# Patient Record
Sex: Female | Born: 1970 | Race: White | Hispanic: No | Marital: Married | Smoking: Never smoker
Health system: Southern US, Community
[De-identification: ages and names within clinical notes are randomized; demographics above are authoritative.]

## PROBLEM LIST (undated history)

## (undated) DIAGNOSIS — T7840XA Allergy, unspecified, initial encounter: Secondary | ICD-10-CM

## (undated) DIAGNOSIS — D649 Anemia, unspecified: Secondary | ICD-10-CM

## (undated) DIAGNOSIS — Z1589 Genetic susceptibility to other disease: Secondary | ICD-10-CM

## (undated) DIAGNOSIS — F419 Anxiety disorder, unspecified: Secondary | ICD-10-CM

## (undated) DIAGNOSIS — M1990 Unspecified osteoarthritis, unspecified site: Secondary | ICD-10-CM

## (undated) DIAGNOSIS — Q513 Bicornate uterus: Secondary | ICD-10-CM

## (undated) DIAGNOSIS — Z8489 Family history of other specified conditions: Secondary | ICD-10-CM

## (undated) DIAGNOSIS — E7211 Homocystinuria: Secondary | ICD-10-CM

## (undated) DIAGNOSIS — E7212 Methylenetetrahydrofolate reductase deficiency: Secondary | ICD-10-CM

## (undated) DIAGNOSIS — D689 Coagulation defect, unspecified: Secondary | ICD-10-CM

## (undated) DIAGNOSIS — N63 Unspecified lump in unspecified breast: Principal | ICD-10-CM

## (undated) HISTORY — DX: Methylenetetrahydrofolate reductase deficiency: E72.12

## (undated) HISTORY — DX: Unspecified osteoarthritis, unspecified site: M19.90

## (undated) HISTORY — PX: BREAST LUMPECTOMY: SHX2

## (undated) HISTORY — DX: Anxiety disorder, unspecified: F41.9

## (undated) HISTORY — DX: Genetic susceptibility to other disease: Z15.89

## (undated) HISTORY — PX: EYE SURGERY: SHX253

## (undated) HISTORY — DX: Bicornate uterus: Q51.3

## (undated) HISTORY — DX: Allergy, unspecified, initial encounter: T78.40XA

## (undated) HISTORY — DX: Coagulation defect, unspecified: D68.9

## (undated) HISTORY — DX: Methylenetetrahydrofolate reductase deficiency: E72.11

---

## 2006-01-01 ENCOUNTER — Emergency Department (HOSPITAL_COMMUNITY): Admission: EM | Admit: 2006-01-01 | Discharge: 2006-01-01 | Payer: Self-pay | Admitting: Emergency Medicine

## 2006-03-28 ENCOUNTER — Ambulatory Visit: Payer: Self-pay | Admitting: Oncology

## 2006-05-11 ENCOUNTER — Ambulatory Visit: Payer: Self-pay | Admitting: Oncology

## 2006-05-15 LAB — CBC WITH DIFFERENTIAL/PLATELET
Basophils Absolute: 0 10*3/uL (ref 0.0–0.1)
Eosinophils Absolute: 0.1 10*3/uL (ref 0.0–0.5)
HCT: 37.1 % (ref 34.8–46.6)
HGB: 13 g/dL (ref 11.6–15.9)
MONO#: 0.6 10*3/uL (ref 0.1–0.9)
NEUT#: 5.9 10*3/uL (ref 1.5–6.5)
NEUT%: 69.9 % (ref 39.6–76.8)
RDW: 13.7 % (ref 11.3–14.5)
WBC: 8.4 10*3/uL (ref 3.9–10.0)
lymph#: 1.7 10*3/uL (ref 0.9–3.3)

## 2006-05-15 LAB — APTT: aPTT: 29 seconds (ref 24–37)

## 2006-05-15 LAB — MORPHOLOGY: PLT EST: ADEQUATE

## 2006-05-16 LAB — SEDIMENTATION RATE: Sed Rate: 2 mm/hr (ref 0–22)

## 2006-07-19 ENCOUNTER — Encounter: Admission: RE | Admit: 2006-07-19 | Discharge: 2006-07-19 | Payer: Self-pay | Admitting: Obstetrics and Gynecology

## 2009-11-09 ENCOUNTER — Encounter: Admission: RE | Admit: 2009-11-09 | Discharge: 2009-11-09 | Payer: Self-pay | Admitting: Obstetrics and Gynecology

## 2010-01-23 ENCOUNTER — Encounter: Payer: Self-pay | Admitting: Obstetrics and Gynecology

## 2011-01-16 ENCOUNTER — Ambulatory Visit (INDEPENDENT_AMBULATORY_CARE_PROVIDER_SITE_OTHER): Payer: BC Managed Care – PPO | Admitting: Sports Medicine

## 2011-01-16 VITALS — BP 100/60 | Ht 65.0 in | Wt 120.0 lb

## 2011-01-16 DIAGNOSIS — M549 Dorsalgia, unspecified: Secondary | ICD-10-CM

## 2011-01-16 NOTE — Patient Instructions (Signed)
1. Do your rotations for warm up.  2. When you do your weight exercises only do them to your comfort level.  You should not work beyond a pain level of 3 on a 10 scale.  3. Do your exercises daily and increase your reps as your strength increased until you can do 3 sets of 10.  4. Easy running, walking, and swimming are ok to do for cardio.  5. Follow up in one month

## 2011-01-17 DIAGNOSIS — M549 Dorsalgia, unspecified: Secondary | ICD-10-CM | POA: Insufficient documentation

## 2011-01-17 NOTE — Assessment & Plan Note (Addendum)
We have given her a HEP for her back pain and for lower extremity weakness and stiffness.  She should continue to do her cardiovascular exercise at her comfort level.  We will follow up with her in one month.

## 2011-01-17 NOTE — Progress Notes (Signed)
  Subjective:    Patient ID: Lindsay Reynolds, female    DOB: [birth year], 41 y.o.   MRN: [id number]  HPI 41 y/o female is here complaining of back and side pain that started after moving heavy furniture 3 months ago.  She has tried OTC's with no relief.  Yoga improved the back pain but the side pain remains.  The pain is achy, starts on her left side and radiates around to the front.  She has no groin pain or hip pain.  She has no leg pain, numbness or weakness.  No incontinence.  No saddle anesthesia.  No history of back trauma.  She is a running but has had difficulty running since this event.  She was improving initially until she took a 10 hour car ride in for Thanksgiving and the pain returned.  Today she has mostly side pain.   Review of Systems     Objective:   Physical Exam  Tenderness to palpation over the right oblique muscle and the quadratus Normal range of motion at the lumbar spine Lower extremity strength is symmetric but she has a generalized weakness about the hips c/w deconditioning DTR's 2+ FABER is non-painful The left leg is longer than the right by one cm but this corrects when she sits up        Assessment & Plan:

## 2011-02-03 HISTORY — PX: EYE SURGERY: SHX253

## 2011-02-11 DIAGNOSIS — E7212 Methylenetetrahydrofolate reductase deficiency: Secondary | ICD-10-CM

## 2011-02-11 DIAGNOSIS — Q513 Bicornate uterus: Secondary | ICD-10-CM

## 2011-02-11 DIAGNOSIS — N83209 Unspecified ovarian cyst, unspecified side: Secondary | ICD-10-CM

## 2011-02-11 DIAGNOSIS — Z9889 Other specified postprocedural states: Secondary | ICD-10-CM | POA: Insufficient documentation

## 2011-02-21 ENCOUNTER — Ambulatory Visit: Payer: BC Managed Care – PPO | Admitting: Sports Medicine

## 2011-11-14 ENCOUNTER — Telehealth: Payer: Self-pay | Admitting: Obstetrics and Gynecology

## 2011-11-14 NOTE — Telephone Encounter (Signed)
Apt scheduled with SR on 11/24/2011 @ 12:40 p.m. Pt voiced understanding Lindsay Reynolds, Lindsay Reynolds

## 2011-11-14 NOTE — Telephone Encounter (Signed)
Staff msg sent to SR to ask about opening on tomorrows schedule.

## 2011-11-14 NOTE — Telephone Encounter (Signed)
LD to address.

## 2011-11-24 ENCOUNTER — Encounter: Payer: Self-pay | Admitting: Obstetrics and Gynecology

## 2011-11-24 ENCOUNTER — Ambulatory Visit (INDEPENDENT_AMBULATORY_CARE_PROVIDER_SITE_OTHER): Payer: BC Managed Care – PPO | Admitting: Obstetrics and Gynecology

## 2011-11-24 VITALS — BP 102/58 | Wt 111.0 lb

## 2011-11-24 DIAGNOSIS — R5383 Other fatigue: Secondary | ICD-10-CM

## 2011-11-24 DIAGNOSIS — D649 Anemia, unspecified: Secondary | ICD-10-CM

## 2011-11-24 DIAGNOSIS — N926 Irregular menstruation, unspecified: Secondary | ICD-10-CM

## 2011-11-24 DIAGNOSIS — R5381 Other malaise: Secondary | ICD-10-CM

## 2011-11-24 DIAGNOSIS — N898 Other specified noninflammatory disorders of vagina: Secondary | ICD-10-CM

## 2011-11-24 LAB — CBC
MCH: 28.7 pg (ref 26.0–34.0)
MCV: 83.8 fL (ref 78.0–100.0)
Platelets: 261 10*3/uL (ref 150–400)
RBC: 4.64 MIL/uL (ref 3.87–5.11)
RDW: 13.7 % (ref 11.5–15.5)

## 2011-11-24 LAB — POCT URINALYSIS DIPSTICK
Bilirubin, UA: NEGATIVE
Blood, UA: NEGATIVE
Glucose, UA: NEGATIVE
Ketones, UA: NEGATIVE
pH, UA: 8

## 2011-11-24 LAB — POCT WET PREP (WET MOUNT)
Bacteria Wet Prep HPF POC: NEGATIVE
Clue Cells Wet Prep Whiff POC: NEGATIVE
WBC, Wet Prep HPF POC: NEGATIVE
pH: 4.5

## 2011-11-24 LAB — POCT OSOM BVBLUE TEST: Bacterial Vaginosis: NEGATIVE

## 2011-11-24 NOTE — Progress Notes (Deleted)
Subjective:    Lindsay Reynolds is a 41 y.o. female, No obstetric history on file., who presents for Vaginal Discomfort.   The following portions of the patient's history were reviewed and updated as appropriate: allergies, current medications, past family history.  Review of Systems Pertinent items are noted in HPI. Breast:Negative for breast lump,nipple discharge or nipple retraction Gastrointestinal: Negative for abdominal pain, change in bowel habits or rectal bleeding Urinary:negative   Objective:    There were no vitals taken for this visit.    Weight:  Wt Readings from Last 1 Encounters:  01/16/11 120 lb (54.432 kg)          BMI: There is no height or weight on file to calculate BMI.  General Appearance: Alert, appropriate appearance for age. No acute distress GYN exam:   Assessment:    {diagnoses; exam gyn:13148}    Plan:    {gyn plan:315269::"mammogram","pap smear","return annually or prn"}  Silverio Lay MD

## 2011-11-24 NOTE — Progress Notes (Signed)
Vaginal discharge: blood tingedthin and thick  Mucoid Now day 20 Itching / Burning: yes Fever: no  Symptoms have been present for 1 week. Has used over-the-counter treatment: yes Associated symptoms:  Pelvic pain: yes       Dyspareunia: yes     Odor:  no  History of STD:  no history of PID, STD's STD screen:declined  Pt stated been having a lot of vaginal discomfort .Pt stated she wants her hormone levels tested.     Vaginal Discharge/Discomfort/Itching  Subjective:   Lindsay Reynolds is an 41 y.o. woman who presents c/o Vaginal discomfort. Vaginal itching and irritation. Having abdominal cramping x 1 month. Cycles are monthly with bleeding lasting x 7 days. Has experienced night sweats on occasion.   Objective: no lesions, no discharge, no CMT, no adnexal masses B Vaginal discharge: minimal Vaginal lesions:  none  Wet prep: normal epithelial cells OSOM BV: negative OSOM Trichomonas:  not done  Assessment: 1. DUB 2. Fatigue / malaise  Plan: Blood work ordered Pelvic ultrasound  TSH, T3, T4, Progesterone, CBC, and Vitamin D done today U/S NV for Irregular bleeding   Silverio Lay MD 11/24/2011 2:16 PM

## 2011-11-25 LAB — T4: T4, Total: 8.2 ug/dL (ref 5.0–12.5)

## 2011-11-25 LAB — PROGESTERONE: Progesterone: 8.1 ng/mL

## 2011-11-25 LAB — T3: T3, Total: 80.5 ng/dL (ref 80.0–204.0)

## 2011-11-28 ENCOUNTER — Telehealth: Payer: Self-pay | Admitting: Obstetrics and Gynecology

## 2011-11-28 NOTE — Telephone Encounter (Signed)
Can you please take  A look at these results? Lindsay Reynolds

## 2011-11-28 NOTE — Telephone Encounter (Signed)
sr pt 

## 2011-11-29 NOTE — Telephone Encounter (Signed)
Advised pt of normal labs Lindsay Reynolds, [name]

## 2011-11-29 NOTE — Telephone Encounter (Signed)
All results are normal and were to be reviewed at next visit

## 2011-12-21 ENCOUNTER — Ambulatory Visit (INDEPENDENT_AMBULATORY_CARE_PROVIDER_SITE_OTHER): Payer: BC Managed Care – PPO

## 2011-12-21 ENCOUNTER — Encounter: Payer: Self-pay | Admitting: Obstetrics and Gynecology

## 2011-12-21 ENCOUNTER — Ambulatory Visit (INDEPENDENT_AMBULATORY_CARE_PROVIDER_SITE_OTHER): Payer: BC Managed Care – PPO | Admitting: Obstetrics and Gynecology

## 2011-12-21 ENCOUNTER — Other Ambulatory Visit: Payer: Self-pay | Admitting: Obstetrics and Gynecology

## 2011-12-21 VITALS — BP 100/54 | Ht 65.0 in | Wt 114.0 lb

## 2011-12-21 DIAGNOSIS — N926 Irregular menstruation, unspecified: Secondary | ICD-10-CM

## 2011-12-21 DIAGNOSIS — N949 Unspecified condition associated with female genital organs and menstrual cycle: Secondary | ICD-10-CM

## 2011-12-21 DIAGNOSIS — R102 Pelvic and perineal pain: Secondary | ICD-10-CM

## 2011-12-21 NOTE — Progress Notes (Signed)
Subjective:    Lindsay Reynolds is a 41 y.o. female, G1P1001, who presents for Gyn ultrasound because of cramping and abnormal bleeding. Feeling better. This last cycle was shorter. More bothered by ovulation symptoms.  The following portions of the patient's history were reviewed and updated as appropriate: allergies, current medications, past family history.  Objective:    LMP 12/07/2011    Weight:  Wt Readings from Last 1 Encounters:  11/24/11 111 lb (50.349 kg)          BMI: There is no height or weight on file to calculate BMI.  ULTRASOUND: Uterus No uterine masses are seen    Adnexa normal    Endometrium 1.13 mm    Free fluid: no    Other findings:  Evaluation of the uterine cavity by 3D images demonstrates an accurate uterus. Endometrium is WNLs  RTOV: normal LTOV: in the superior portion of LO there is a complex cyst. It is hypoechoic with a solid rim. Measures 2.3 cm. Appearance could represent a resolving cyst or CL. There is normal color flow to the LTOV. There is mild + CDS fluid suggest f/u for evaluation of change and or resolution.    Thyroid panel, CBC, Vitamin D all normal   Assessment:    LLq pain with possible resolving CL    Plan:    Suggest repeat ultrasound 4-6 weeks  Silverio Lay MD

## 2012-01-31 ENCOUNTER — Ambulatory Visit: Payer: BC Managed Care – PPO | Admitting: Obstetrics and Gynecology

## 2012-01-31 ENCOUNTER — Encounter: Payer: Self-pay | Admitting: Obstetrics and Gynecology

## 2012-01-31 ENCOUNTER — Ambulatory Visit: Payer: BC Managed Care – PPO

## 2012-01-31 ENCOUNTER — Other Ambulatory Visit: Payer: Self-pay | Admitting: Obstetrics and Gynecology

## 2012-01-31 VITALS — BP 98/54 | HR 99 | Ht 65.0 in | Wt 114.0 lb

## 2012-01-31 DIAGNOSIS — R102 Pelvic and perineal pain: Secondary | ICD-10-CM

## 2012-01-31 DIAGNOSIS — N83209 Unspecified ovarian cyst, unspecified side: Secondary | ICD-10-CM

## 2012-01-31 NOTE — Progress Notes (Signed)
Subjective:    Lindsay Reynolds is a 42 y.o. female, G1P1001, who presents for Gyn ultrasound because of left sided  pelvic pain x 2 months. Pt has taken Advil before which helps sometimes. Her pain varies especially when exercising.  The following portions of the patient's history were reviewed and updated as appropriate: allergies, current medications, past family history.  Objective:    There were no vitals taken for this visit.    Weight:  Wt Readings from Last 1 Encounters:  12/21/11 114 lb (51.71 kg)          BMI: There is no height or weight on file to calculate BMI.  ULTRASOUND: Uterus AV    Adnexa normal    Endometrium 0.448 cm    Free fluid: minimal    Left ovarian cyst resolved. 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 Will consult with Dr Marlena Clipper.  Silverio Lay MD

## 2012-02-01 ENCOUNTER — Telehealth: Payer: Self-pay

## 2012-02-01 NOTE — Telephone Encounter (Addendum)
Pt advised of opinion of Dr. Cyndie Chime. She has scheduled her aex for 04/03/2012 and is wanting to know which options you recommend for her. She is wanting something with a Low Dosage. She will research the options you give and then decide which one she would like to try. Pt wishes for [place] to contact her on her cell phone [phone]   Darien Ramus, CMA

## 2012-02-01 NOTE — Telephone Encounter (Signed)
Message copied by Darien Ramus on Thu Feb 01, 2012  2:48 PM ------      Message from: Silverio Lay      Created: Thu Feb 01, 2012 12:57 PM      Regarding: FW: Opinion on oral contraceptive       Please call pt and inform of this medical opinion from Dr Cyndie Chime.      ----- Message -----         From: Levert Feinstein, MD         Sent: 01/31/2012   7:22 PM           To: Esmeralda Arthur, MD      Subject: RE: Opinion on oral contraceptive                        Dois Davenport - I reviewed her record - I see no contraindication to using an OC      Lowella Dandy      ----- Message -----         From: Esmeralda Arthur, MD         Sent: 01/31/2012   1:47 PM           To: Levert Feinstein, MD      Subject: Opinion on oral contraceptive                            Dr Cyndie Chime,            This is a patient you have met before in the context of planning a pregnancy. Now wanting some cycle management. Can she use oral contraceptives?            Dois Davenport

## 2012-02-01 NOTE — Telephone Encounter (Signed)
LVM for return call  Dezarai Prew, CMA  

## 2012-02-05 NOTE — Telephone Encounter (Signed)
LVM for pt to return call to advise of BC options and see if pt wishes to continue with RX  Darien Ramus, CMA

## 2012-02-05 NOTE — Telephone Encounter (Signed)
Any combined BCP with 20 mcg of Estrogen: Loestrin 1/20, Dianah Field

## 2012-04-03 ENCOUNTER — Ambulatory Visit: Payer: BC Managed Care – PPO | Admitting: Obstetrics and Gynecology

## 2012-04-11 ENCOUNTER — Ambulatory Visit (INDEPENDENT_AMBULATORY_CARE_PROVIDER_SITE_OTHER): Payer: BC Managed Care – PPO | Admitting: Family Medicine

## 2012-04-11 VITALS — BP 120/77 | HR 86 | Temp 98.2°F | Resp 16 | Ht 66.0 in | Wt 117.0 lb

## 2012-04-11 DIAGNOSIS — M79609 Pain in unspecified limb: Secondary | ICD-10-CM

## 2012-04-11 DIAGNOSIS — Z23 Encounter for immunization: Secondary | ICD-10-CM

## 2012-04-11 LAB — POCT CBC
Granulocyte percent: 69.2 %G (ref 37–80)
HCT, POC: 40 % (ref 37.7–47.9)
Hemoglobin: 12.7 g/dL (ref 12.2–16.2)
MCV: 88 fL (ref 80–97)
POC LYMPH PERCENT: 22.4 %L (ref 10–50)
RBC: 4.54 M/uL (ref 4.04–5.48)

## 2012-04-11 LAB — C-REACTIVE PROTEIN: CRP: <0.5 mg/dL (ref <0.60)

## 2012-04-11 LAB — RHEUMATOID FACTOR: Rhuematoid fact SerPl-aCnc: <10 IU/mL (ref <=14)

## 2012-04-11 LAB — COMPREHENSIVE METABOLIC PANEL
Albumin: 4.7 g/dL (ref 3.5–5.2)
BUN: 11 mg/dL (ref 6–23)
CO2: 24 mEq/L (ref 19–32)
Calcium: 9.6 mg/dL (ref 8.4–10.5)
Chloride: 105 mEq/L (ref 96–112)
Glucose, Bld: 84 mg/dL (ref 70–99)
Potassium: 4.6 mEq/L (ref 3.5–5.3)
Sodium: 139 mEq/L (ref 135–145)
Total Protein: 7.1 g/dL (ref 6.0–8.3)

## 2012-04-11 NOTE — Patient Instructions (Addendum)
We will be in touch with your labs when they come in.

## 2012-04-11 NOTE — Progress Notes (Addendum)
Subjective:    Patient ID: Lindsay Reynolds, female    DOB: 02-07-1970, 42 y.o.   MRN: [id number]  HPI Wants tetanus shot- she is overdue.   Also reports she has been having eye discomfort since Lasik in 02/2011. Has seen ophthalmologist who states that she is not healing properly. Recommended that she have a test for rheumatoid arthritis. Also reports that she gets joint pain, especially in fingers. Is worried that she has some kind of autoimmune disease.  She has noted that her fingers- especially the PIP joints- will be stiff, sore, and sometimes swollen.  Just has not felt like herself for the last year or so.   No weight changes She has her menses right now.  Notes that her GYN is following her for ovarian cysts.      Review of Systems  Constitutional: Positive for fatigue (Depends on the day. Cycles between having energy and not having any energy). Negative for fever, chills and unexpected weight change.  Eyes: Positive for pain, discharge and itching. Negative for visual disturbance.  Respiratory: Negative.   Cardiovascular: Negative.   Gastrointestinal: Negative for nausea, vomiting, abdominal pain, diarrhea and constipation.  Genitourinary: Positive for menstrual problem (Ovarian cycts, seeing gyn for this). Negative for dysuria.  Musculoskeletal: Positive for joint swelling and arthralgias.  Allergic/Immunologic: Positive for environmental allergies.  Neurological: Negative for dizziness, weakness and numbness.       Objective:   Physical Exam  Constitutional: She is oriented to person, place, and time. She appears well-developed and well-nourished.  HENT:  Head: Normocephalic and atraumatic.  Right Ear: External ear normal.  Left Ear: External ear normal.  Eyes: Conjunctivae and EOM are normal. Pupils are equal, round, and reactive to light. Right eye exhibits discharge (watery). Left eye exhibits discharge.  Neck: Normal range of motion. Neck supple.  Cardiovascular: Normal  rate, regular rhythm and normal heart sounds.   Pulmonary/Chest: Effort normal and breath sounds normal.  Abdominal: Soft. There is tenderness (mild LLQ).  Musculoskeletal:  Mild swelling in MCP and PIP joints of both hands  Neurological: She is alert and oriented to person, place, and time.  eye discharge is not crusty, no injection.    Results for orders placed in visit on 04/11/12  RHEUMATOID FACTOR      Result Value Range   Rheumatoid Factor <10  <=14 IU/mL  CYCLIC CITRUL PEPTIDE ANTIBODY, IGG      Result Value Range   Cyclic Citrullin Peptide Ab    0.0 - 5.0 U/mL  C-REACTIVE PROTEIN      Result Value Range   CRP <0.5  <0.60 mg/dL  ANA      Result Value Range   ANA      COMPREHENSIVE METABOLIC PANEL      Result Value Range   Sodium 139  135 - 145 mEq/L   Potassium 4.6  3.5 - 5.3 mEq/L   Chloride 105  96 - 112 mEq/L   CO2 24  19 - 32 mEq/L   Glucose, Bld 84  70 - 99 mg/dL   BUN 11  6 - 23 mg/dL   Creat 7.82  9.56 - 2.13 mg/dL   Total Bilirubin 0.9  0.3 - 1.2 mg/dL   Alkaline Phosphatase 41  39 - 117 U/L   AST 20  0 - 37 U/L   ALT 16  0 - 35 U/L   Total Protein 7.1  6.0 - 8.3 g/dL   Albumin 4.7  3.5 - 5.2 g/dL  Calcium 9.6  8.4 - 10.5 mg/dL  POCT SEDIMENTATION RATE      Result Value Range   POCT SED RATE 6  0 - 22 mm/hr  POCT CBC      Result Value Range   WBC 6.5  4.6 - 10.2 K/uL   Lymph, poc 1.5  0.6 - 3.4   POC LYMPH PERCENT 22.4  10 - 50 %L   MID (cbc) 0.5  0 - 0.9   POC MID % 8.4  0 - 12 %M   POC Granulocyte 4.5  2 - 6.9   Granulocyte percent 69.2  37 - 80 %G   RBC 4.54  4.04 - 5.48 M/uL   Hemoglobin 12.7  12.2 - 16.2 g/dL   HCT, POC 16.1  09.6 - 47.9 %   MCV 88.0  80 - 97 fL   MCH, POC 28.0  27 - 31.2 pg   MCHC 31.8  31.8 - 35.4 g/dL   RDW, POC 04.5     Platelet Count, POC 304  142 - 424 K/uL   MPV 8.5  0 - 99.8 fL        Assessment & Plan:  42 yo female presenting for TDAP and testing for rheumatoid arthritis 1) Immunization: given TDAP  today 2) Testing for autoimmune disease: Ordered rheumatoid factor, CCP, CRP, sed rate, ANA. Consider rheumatoid arthritis vs. Lupus. Will call with results. Follow up based on results.  Her symptoms are stable and not acute at this time.  She will let us know if any change or worsening of her symptoms.     addnd 4/12:  Results for orders placed in visit on 04/11/12  RHEUMATOID FACTOR      Result Value Range   Rheumatoid Factor <10  <=14 IU/mL  CYCLIC CITRUL PEPTIDE ANTIBODY, IGG      Result Value Range   Cyclic Citrullin Peptide Ab <4.0  0.0 - 5.0 U/mL  C-REACTIVE PROTEIN      Result Value Range   CRP <0.5  <0.60 mg/dL  ANA      Result Value Range   ANA NEG  NEGATIVE  COMPREHENSIVE METABOLIC PANEL      Result Value Range   Sodium 139  135 - 145 mEq/L   Potassium 4.6  3.5 - 5.3 mEq/L   Chloride 105  96 - 112 mEq/L   CO2 24  19 - 32 mEq/L   Glucose, Bld 84  70 - 99 mg/dL   BUN 11  6 - 23 mg/dL   Creat 9.81  1.91 - 4.78 mg/dL   Total Bilirubin 0.9  0.3 - 1.2 mg/dL   Alkaline Phosphatase 41  39 - 117 U/L   AST 20  0 - 37 U/L   ALT 16  0 - 35 U/L   Total Protein 7.1  6.0 - 8.3 g/dL   Albumin 4.7  3.5 - 5.2 g/dL   Calcium 9.6  8.4 - 29.5 mg/dL  POCT SEDIMENTATION RATE      Result Value Range   POCT SED RATE 6  0 - 22 mm/hr  POCT CBC      Result Value Range   WBC 6.5  4.6 - 10.2 K/uL   Lymph, poc 1.5  0.6 - 3.4   POC LYMPH PERCENT 22.4  10 - 50 %L   MID (cbc) 0.5  0 - 0.9   POC MID % 8.4  0 - 12 %M   POC Granulocyte 4.5  2 - 6.9  Granulocyte percent 69.2  37 - 80 %G   RBC 4.54  4.04 - 5.48 M/uL   Hemoglobin 12.7  12.2 - 16.2 g/dL   HCT, POC 40.9  81.1 - 47.9 %   MCV 88.0  80 - 97 fL   MCH, POC 28.0  27 - 31.2 pg   MCHC 31.8  31.8 - 35.4 g/dL   RDW, POC 91.4     Platelet Count, POC 304  142 - 424 K/uL   MPV 8.5  0 - 99.8 fL   LMOM- all labs normal, this is reassuring that she does not have RA.  However, RA can evolve over time so if she continues to have symptoms we  can run labs again in 3 or 4 months

## 2012-04-12 LAB — CYCLIC CITRUL PEPTIDE ANTIBODY, IGG: Cyclic Citrullin Peptide Ab: <2 U/mL (ref 0.0–5.0)

## 2012-04-13 ENCOUNTER — Encounter: Payer: Self-pay | Admitting: Family Medicine

## 2012-04-15 ENCOUNTER — Telehealth: Payer: Self-pay

## 2012-04-15 NOTE — Telephone Encounter (Signed)
PATIENT IS CALLING ABOUT RESULTS OF BLOODWORK. [phone]

## 2012-04-16 NOTE — Telephone Encounter (Signed)
LMOM- there is a letter on its way with details, but all labs were normal

## 2012-04-18 ENCOUNTER — Telehealth: Payer: Self-pay | Admitting: Radiology

## 2012-04-18 ENCOUNTER — Telehealth: Payer: Self-pay

## 2012-04-18 NOTE — Telephone Encounter (Signed)
Called her back- LMOM at home.  All labs normal, letter should arrive soon.  We can do an ultrasound for gallstones if she would like.  Will try her again later

## 2012-04-18 NOTE — Telephone Encounter (Signed)
Do not see anything, she now states name is Lindsay Reynolds.

## 2012-04-18 NOTE — Telephone Encounter (Signed)
Patient states she is concerned she has gallstones, has not felt well for several months. Wants to know if you can advise on this. Please advise. She states she just does not feel well. Advised her preliminary labs normal, still waiting for the full panel to result.

## 2012-04-18 NOTE — Telephone Encounter (Signed)
Pt is calling about her lab results. She said that she seen Dr. Patsy Lager sometime last week. She would like For someone to give her a call back to let her know if they are done or if still waiting to have them back. Call back number is [phone]

## 2012-04-20 NOTE — Telephone Encounter (Signed)
Called no answer LMOM.

## 2012-04-22 NOTE — Telephone Encounter (Signed)
Called her again- no answer on cell, LMOM Called home number and she did pick up.  She did receive her labs and feels reassured.  She does not have RUQ pain or any post- prandial pain.  Therefore, do not suspect she has gallbladder problems.   Asked her to come back for a further discussion and x-rays if needed (chronic hand pain).  She plans to come in soon for a recheck

## 2012-09-04 ENCOUNTER — Encounter: Payer: Self-pay | Admitting: Obstetrics and Gynecology

## 2012-10-25 ENCOUNTER — Other Ambulatory Visit: Payer: Self-pay

## 2012-10-25 DIAGNOSIS — Z1231 Encounter for screening mammogram for malignant neoplasm of breast: Secondary | ICD-10-CM

## 2012-11-18 ENCOUNTER — Ambulatory Visit
Admission: RE | Admit: 2012-11-18 | Discharge: 2012-11-18 | Disposition: A | Discharge status: Home / Self Care | Payer: BC Managed Care – PPO | Source: Ambulatory Visit

## 2012-11-18 DIAGNOSIS — Z1231 Encounter for screening mammogram for malignant neoplasm of breast: Secondary | ICD-10-CM

## 2013-10-22 ENCOUNTER — Other Ambulatory Visit: Payer: Self-pay

## 2013-10-22 DIAGNOSIS — Z1231 Encounter for screening mammogram for malignant neoplasm of breast: Secondary | ICD-10-CM

## 2013-11-03 ENCOUNTER — Encounter: Payer: Self-pay | Admitting: Obstetrics and Gynecology

## 2013-11-11 ENCOUNTER — Other Ambulatory Visit: Payer: Self-pay | Admitting: Family Medicine

## 2013-11-11 DIAGNOSIS — R1011 Right upper quadrant pain: Secondary | ICD-10-CM

## 2013-11-19 ENCOUNTER — Ambulatory Visit
Admission: RE | Admit: 2013-11-19 | Discharge: 2013-11-19 | Disposition: A | Discharge status: Home / Self Care | Payer: BC Managed Care – PPO | Source: Ambulatory Visit

## 2013-11-19 ENCOUNTER — Encounter (INDEPENDENT_AMBULATORY_CARE_PROVIDER_SITE_OTHER): Payer: Self-pay

## 2013-11-19 DIAGNOSIS — Z1231 Encounter for screening mammogram for malignant neoplasm of breast: Secondary | ICD-10-CM

## 2013-12-02 ENCOUNTER — Other Ambulatory Visit: Payer: BC Managed Care – PPO

## 2014-01-13 ENCOUNTER — Ambulatory Visit
Admission: RE | Admit: 2014-01-13 | Discharge: 2014-01-13 | Disposition: A | Discharge status: Home / Self Care | Payer: BC Managed Care – PPO | Source: Ambulatory Visit | Attending: Family Medicine | Admitting: Family Medicine

## 2014-01-13 DIAGNOSIS — R1011 Right upper quadrant pain: Secondary | ICD-10-CM

## 2014-09-15 ENCOUNTER — Other Ambulatory Visit: Payer: Self-pay

## 2014-09-15 DIAGNOSIS — Z1231 Encounter for screening mammogram for malignant neoplasm of breast: Secondary | ICD-10-CM

## 2014-12-02 ENCOUNTER — Ambulatory Visit: Payer: BC Managed Care – PPO

## 2014-12-03 ENCOUNTER — Ambulatory Visit
Admission: RE | Admit: 2014-12-03 | Discharge: 2014-12-03 | Disposition: A | Discharge status: Home / Self Care | Payer: BC Managed Care – PPO | Source: Ambulatory Visit

## 2014-12-03 DIAGNOSIS — Z1231 Encounter for screening mammogram for malignant neoplasm of breast: Secondary | ICD-10-CM

## 2015-07-12 ENCOUNTER — Other Ambulatory Visit: Payer: Self-pay | Admitting: General Surgery

## 2015-08-03 HISTORY — PX: COLONOSCOPY: SHX174

## 2015-08-04 ENCOUNTER — Ambulatory Visit
Admission: RE | Admit: 2015-08-04 | Discharge: 2015-08-04 | Disposition: A | Discharge status: Home / Self Care | Payer: BC Managed Care – PPO | Source: Ambulatory Visit | Attending: Family Medicine | Admitting: Family Medicine

## 2015-08-04 ENCOUNTER — Other Ambulatory Visit: Payer: Self-pay | Admitting: Family Medicine

## 2015-08-04 DIAGNOSIS — R059 Cough, unspecified: Secondary | ICD-10-CM

## 2015-08-04 DIAGNOSIS — R05 Cough: Secondary | ICD-10-CM

## 2015-08-30 ENCOUNTER — Other Ambulatory Visit: Payer: Self-pay | Admitting: Obstetrics and Gynecology

## 2015-08-30 ENCOUNTER — Encounter: Payer: Self-pay | Admitting: Hematology

## 2015-08-30 ENCOUNTER — Telehealth: Payer: Self-pay | Admitting: Hematology

## 2015-08-30 NOTE — Telephone Encounter (Signed)
Appointment scheduled for Mon 9/11 with [place]. Patient agreed. Demographics verified. Letter to the referring and mailed to the patient. Location given. Patient

## 2015-09-01 ENCOUNTER — Encounter (HOSPITAL_COMMUNITY): Payer: Self-pay | Admitting: *Deleted

## 2015-09-01 NOTE — Patient Instructions (Addendum)
Your procedure is scheduled on: September 6,2017  Enter through the Main Entrance of [facility] at: 7:30 am  Pick up the phone at the desk and dial [phone].  Call this number if you have problems the morning of surgery: [phone].  Remember: Do NOT eat food or drink liquids (including water) after: Midnight, September 5   Take these medicines the morning of surgery with a SIP OF WATER: None  Do NOT wear jewelry (body piercing), metal hair clips/bobby pins, make-up, or nail polish. Do NOT wear lotions, powders, or perfumes.  You may wear deoderant. Do NOT shave for 48 hours prior to surgery. Do NOT bring valuables to the hospital.  Have a responsible adult drive you home and stay with you for 24 hours after your procedure

## 2015-09-02 ENCOUNTER — Encounter (HOSPITAL_COMMUNITY): Payer: Self-pay

## 2015-09-02 ENCOUNTER — Encounter (HOSPITAL_COMMUNITY)
Admission: RE | Admit: 2015-09-02 | Discharge: 2015-09-02 | Disposition: A | Discharge status: Home / Self Care | Payer: BC Managed Care – PPO | Source: Ambulatory Visit | Attending: Obstetrics and Gynecology | Admitting: Obstetrics and Gynecology

## 2015-09-02 DIAGNOSIS — Z01812 Encounter for preprocedural laboratory examination: Secondary | ICD-10-CM | POA: Insufficient documentation

## 2015-09-02 HISTORY — DX: Anemia, unspecified: D64.9

## 2015-09-02 HISTORY — DX: Family history of other specified conditions: Z84.89

## 2015-09-02 LAB — CBC
HCT: 36 % (ref 36.0–46.0)
Hemoglobin: 12.1 g/dL (ref 12.0–15.0)
MCH: 26.7 pg (ref 26.0–34.0)
MCHC: 33.6 g/dL (ref 30.0–36.0)
MCV: 79.5 fL (ref 78.0–100.0)
PLATELETS: 247 10*3/uL (ref 150–400)
RBC: 4.53 MIL/uL (ref 3.87–5.11)
RDW: 17 % — AB (ref 11.5–15.5)
WBC: 7.9 10*3/uL (ref 4.0–10.5)

## 2015-09-07 MED ORDER — CEFAZOLIN SODIUM-DEXTROSE 2-4 GM/100ML-% IV SOLN
2.0000 g | INTRAVENOUS | Status: AC
Start: 2015-09-08 — End: 2015-09-08
  Administered 2015-09-08: 2 g via INTRAVENOUS

## 2015-09-08 ENCOUNTER — Ambulatory Visit (HOSPITAL_COMMUNITY): Payer: BC Managed Care – PPO | Admitting: Anesthesiology

## 2015-09-08 ENCOUNTER — Encounter (HOSPITAL_COMMUNITY)
Admission: RE | Disposition: A | Discharge status: Home / Self Care | Payer: Self-pay | Source: Ambulatory Visit | Attending: Obstetrics and Gynecology

## 2015-09-08 ENCOUNTER — Encounter (HOSPITAL_COMMUNITY): Payer: Self-pay | Admitting: *Deleted

## 2015-09-08 ENCOUNTER — Ambulatory Visit (HOSPITAL_COMMUNITY)
Admission: RE | Admit: 2015-09-08 | Discharge: 2015-09-08 | Disposition: A | Discharge status: Home / Self Care | Payer: BC Managed Care – PPO | Source: Ambulatory Visit | Attending: Obstetrics and Gynecology | Admitting: Obstetrics and Gynecology

## 2015-09-08 DIAGNOSIS — D509 Iron deficiency anemia, unspecified: Secondary | ICD-10-CM | POA: Diagnosis not present

## 2015-09-08 DIAGNOSIS — N840 Polyp of corpus uteri: Secondary | ICD-10-CM | POA: Diagnosis not present

## 2015-09-08 DIAGNOSIS — D689 Coagulation defect, unspecified: Secondary | ICD-10-CM | POA: Insufficient documentation

## 2015-09-08 DIAGNOSIS — N939 Abnormal uterine and vaginal bleeding, unspecified: Secondary | ICD-10-CM | POA: Diagnosis not present

## 2015-09-08 HISTORY — PX: DILATATION & CURETTAGE/HYSTEROSCOPY WITH MYOSURE: SHX6511

## 2015-09-08 SURGERY — DILATATION & CURETTAGE/HYSTEROSCOPY WITH MYOSURE
Anesthesia: General | Site: Vagina

## 2015-09-08 MED ORDER — DEXAMETHASONE SODIUM PHOSPHATE 4 MG/ML IJ SOLN
INTRAMUSCULAR | Status: DC | PRN
  Administered 2015-09-08: 4 mg via INTRAVENOUS

## 2015-09-08 MED ORDER — ONDANSETRON HCL 4 MG/2ML IJ SOLN
INTRAMUSCULAR | Status: AC
  Filled 2015-09-08: qty 2

## 2015-09-08 MED ORDER — KETOROLAC TROMETHAMINE 30 MG/ML IJ SOLN
INTRAMUSCULAR | Status: DC | PRN
  Administered 2015-09-08: 30 mg via INTRAVENOUS

## 2015-09-08 MED ORDER — SCOPOLAMINE 1 MG/3DAYS TD PT72
1.0000 | MEDICATED_PATCH | Freq: Once | TRANSDERMAL | Status: DC
  Administered 2015-09-08: 1.5 mg via TRANSDERMAL

## 2015-09-08 MED ORDER — FENTANYL CITRATE (PF) 100 MCG/2ML IJ SOLN
INTRAMUSCULAR | Status: DC | PRN
  Administered 2015-09-08 (×4): 50 ug via INTRAVENOUS

## 2015-09-08 MED ORDER — ACETAMINOPHEN 160 MG/5ML PO SOLN
975.0000 mg | Freq: Once | ORAL | Status: AC
  Administered 2015-09-08: 975 mg via ORAL

## 2015-09-08 MED ORDER — LIDOCAINE HCL 2 % EX GEL
CUTANEOUS | Status: AC
  Filled 2015-09-08: qty 5

## 2015-09-08 MED ORDER — SODIUM CHLORIDE 0.9 % IR SOLN
Status: DC | PRN
  Administered 2015-09-08: 3000 mL

## 2015-09-08 MED ORDER — BUPIVACAINE HCL (PF) 0.25 % IJ SOLN
INTRAMUSCULAR | Status: DC | PRN
Start: 2015-09-08 — End: 2015-09-08
  Administered 2015-09-08: 20 mL

## 2015-09-08 MED ORDER — MIDAZOLAM HCL 2 MG/2ML IJ SOLN
INTRAMUSCULAR | Status: AC
  Filled 2015-09-08: qty 2

## 2015-09-08 MED ORDER — ACETAMINOPHEN 160 MG/5ML PO SOLN
ORAL | Status: AC
  Administered 2015-09-08: 975 mg via ORAL
  Filled 2015-09-08: qty 40.6

## 2015-09-08 MED ORDER — DEXAMETHASONE SODIUM PHOSPHATE 4 MG/ML IJ SOLN
INTRAMUSCULAR | Status: AC
  Filled 2015-09-08: qty 1

## 2015-09-08 MED ORDER — GLYCOPYRROLATE 0.2 MG/ML IJ SOLN
INTRAMUSCULAR | Status: DC | PRN
  Administered 2015-09-08: 0.1 mg via INTRAVENOUS

## 2015-09-08 MED ORDER — SODIUM CHLORIDE 0.9 % IJ SOLN
INTRAMUSCULAR | Status: DC | PRN
  Administered 2015-09-08: 50 mL

## 2015-09-08 MED ORDER — FENTANYL CITRATE (PF) 100 MCG/2ML IJ SOLN
INTRAMUSCULAR | Status: AC
  Filled 2015-09-08: qty 4

## 2015-09-08 MED ORDER — PHENYLEPHRINE HCL 10 MG/ML IJ SOLN
INTRAMUSCULAR | Status: DC | PRN
  Administered 2015-09-08: 40 ug via INTRAVENOUS

## 2015-09-08 MED ORDER — LIDOCAINE HCL (CARDIAC) 20 MG/ML IV SOLN
INTRAVENOUS | Status: DC | PRN
  Administered 2015-09-08: 100 mg via INTRAVENOUS

## 2015-09-08 MED ORDER — LIDOCAINE HCL (CARDIAC) 20 MG/ML IV SOLN
INTRAVENOUS | Status: AC
  Filled 2015-09-08: qty 5

## 2015-09-08 MED ORDER — VASOPRESSIN 20 UNIT/ML IV SOLN
INTRAVENOUS | Status: DC | PRN
Start: 2015-09-08 — End: 2015-09-08
  Administered 2015-09-08: 20 [IU] via SUBCUTANEOUS

## 2015-09-08 MED ORDER — VASOPRESSIN 20 UNIT/ML IV SOLN
INTRAVENOUS | Status: AC
  Filled 2015-09-08: qty 1

## 2015-09-08 MED ORDER — MIDAZOLAM HCL 2 MG/2ML IJ SOLN
INTRAMUSCULAR | Status: DC | PRN
  Administered 2015-09-08: 1 mg via INTRAVENOUS

## 2015-09-08 MED ORDER — SCOPOLAMINE 1 MG/3DAYS TD PT72
MEDICATED_PATCH | TRANSDERMAL | Status: AC
  Administered 2015-09-08: 1.5 mg via TRANSDERMAL
  Filled 2015-09-08: qty 1

## 2015-09-08 MED ORDER — BUPIVACAINE HCL (PF) 0.25 % IJ SOLN
INTRAMUSCULAR | Status: AC
  Filled 2015-09-08: qty 30

## 2015-09-08 MED ORDER — ONDANSETRON HCL 4 MG/2ML IJ SOLN
INTRAMUSCULAR | Status: DC | PRN
  Administered 2015-09-08: 4 mg via INTRAVENOUS

## 2015-09-08 MED ORDER — PROPOFOL 10 MG/ML IV BOLUS
INTRAVENOUS | Status: AC
Start: 2015-09-08 — End: 2015-09-08
  Filled 2015-09-08: qty 20

## 2015-09-08 MED ORDER — LACTATED RINGERS IV SOLN
INTRAVENOUS | Status: DC
  Administered 2015-09-08: 08:00:00 via INTRAVENOUS

## 2015-09-08 MED ORDER — PROPOFOL 10 MG/ML IV BOLUS
INTRAVENOUS | Status: DC | PRN
  Administered 2015-09-08: 150 mg via INTRAVENOUS

## 2015-09-08 MED ORDER — TRAMADOL HCL 50 MG PO TABS: 50.0000 mg | ORAL_TABLET | Freq: Four times a day (QID) | ORAL | 0 refills | Status: DC | PRN

## 2015-09-08 MED ORDER — SODIUM CHLORIDE 0.9 % IJ SOLN
INTRAMUSCULAR | Status: AC
  Filled 2015-09-08: qty 50

## 2015-09-08 SURGICAL SUPPLY — 19 items
CANISTER SUCT 3000ML (MISCELLANEOUS) ×2 IMPLANT
CATH ROBINSON RED A/P 16FR (CATHETERS) ×2 IMPLANT
CLOTH BEACON ORANGE TIMEOUT ST (SAFETY) ×2 IMPLANT
CONTAINER PREFILL 10% NBF 60ML (FORM) ×4 IMPLANT
DEVICE MYOSURE LITE (MISCELLANEOUS) ×2 IMPLANT
DEVICE MYOSURE REACH (MISCELLANEOUS) IMPLANT
FILTER ARTHROSCOPY CONVERTOR (FILTER) ×2 IMPLANT
GLOVE BIO SURGEON STRL SZ7.5 (GLOVE) ×2 IMPLANT
GLOVE BIOGEL PI IND STRL 7.0 (GLOVE) ×1 IMPLANT
GLOVE BIOGEL PI INDICATOR 7.0 (GLOVE) ×1
GOWN STRL REUS W/TWL LRG LVL3 (GOWN DISPOSABLE) ×6 IMPLANT
PACK VAGINAL MINOR WOMEN LF (CUSTOM PROCEDURE TRAY) ×2 IMPLANT
PAD OB MATERNITY 4.3X12.25 (PERSONAL CARE ITEMS) ×2 IMPLANT
SEAL ROD LENS SCOPE MYOSURE (ABLATOR) ×2 IMPLANT
SYR TB 1ML 25GX5/8 (SYRINGE) ×2 IMPLANT
TOWEL OR 17X24 6PK STRL BLUE (TOWEL DISPOSABLE) ×4 IMPLANT
TUBING AQUILEX INFLOW (TUBING) ×2 IMPLANT
TUBING AQUILEX OUTFLOW (TUBING) ×2 IMPLANT
WATER STERILE IRR 1000ML POUR (IV SOLUTION) ×2 IMPLANT

## 2015-09-08 NOTE — Progress Notes (Signed)
Patient ID: Lindsay Reynolds, female   DOB: 1970-09-19, 45 y.o.   MRN: [id number] Patient seen and examined. Consent witnessed and signed. No changes noted. Update completed.

## 2015-09-08 NOTE — Anesthesia Preprocedure Evaluation (Signed)
Anesthesia Evaluation  Patient identified by MRN, date of birth, ID band Patient awake    Reviewed: Allergy & Precautions, H&P , Patient's Chart, lab work & pertinent test results, reviewed documented beta blocker date and time   Airway Mallampati: II  TM Distance: >3 FB Neck ROM: full    Dental no notable dental hx.    Pulmonary    Pulmonary exam normal breath sounds clear to auscultation       Cardiovascular  Rhythm:regular Rate:Normal     Neuro/Psych    GI/Hepatic   Endo/Other    Renal/GU      Musculoskeletal   Abdominal   Peds  Hematology   Anesthesia Other Findings   Reproductive/Obstetrics                             Anesthesia Physical Anesthesia Plan  ASA: II  Anesthesia Plan:    Post-op Pain Management:    Induction: Intravenous  Airway Management Planned: LMA  Additional Equipment:   Intra-op Plan:   Post-operative Plan:   Informed Consent: I have reviewed the patients History and Physical, chart, labs and discussed the procedure including the risks, benefits and alternatives for the proposed anesthesia with the patient or authorized representative who has indicated his/her understanding and acceptance.   Dental Advisory Given and Dental advisory given  Plan Discussed with: CRNA and Surgeon  Anesthesia Plan Comments: (Discussed GA with LMA, possible sore throat, potential need to switch to ETT, N/V, pulmonary aspiration. Questions answered. )        Anesthesia Quick Evaluation

## 2015-09-08 NOTE — H&P (Signed)
Lindsay Reynolds is an 45 y.o. female for surgical evaluation of structural lesion on sonohys and AUB.  Pertinent Gynecological History: Menses: flow is moderate Bleeding: intermenstrual bleeding Contraception: none DES exposure: denies Blood transfusions: none Sexually transmitted diseases: no past history Previous GYN Procedures: na  Last mammogram: normal Date: 2017 Last pap: normal Date: 2017 OB History: G1, P1   Menstrual History: Menarche age: 51 Patient's last menstrual period was 08/31/2015.    Past Medical History:  Diagnosis Date  . Allergy   . Anemia    iron deficient  . Anxiety    situational no meds  . Arthritis   . Clotting disorder ([place])    per pt the clotting disorder was noted when she was pregnant 12 years ago- and no problem unless she is pregnant.  . Family history of adverse reaction to anesthesia    mother PONV  . 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 SVD (spontaneous vaginal delivery)    x 1  . Uterus bicornis bicollus     Past Surgical History:  Procedure Laterality Date  . BREAST LUMPECTOMY Left   . COLONOSCOPY  08/2015  . EYE SURGERY Bilateral 02/2011   Lasik   . EYE SURGERY      Family History  Problem Relation Age of Onset  . Breast cancer Other   . Cancer Mother     skin  . Hyperlipidemia Mother   . Hyperlipidemia Father   . Ovarian cancer Paternal Grandmother   . Cancer Paternal Grandmother     ovarian  . Stroke Paternal Grandfather     Social History:  reports that she has never smoked. She has never used smokeless tobacco. She reports that she drinks about 0.6 oz of alcohol per week . She reports that she does not use drugs.  Allergies: No Known Allergies  Prescriptions Prior to Admission  Medication Sig Dispense Refill Last Dose  . Docosahexaenoic Acid (DHA OMEGA 3 PO) Take 1-2 capsules by mouth daily.   09/07/2015 at Unknown time  . IRON PO Take 1  tablet by mouth daily.   09/07/2015 at Unknown time  . Ivermectin (SOOLANTRA) 1 % CREA Apply 1 application topically every evening.   Past Week at Unknown time  . Pediatric Multiple Vitamins (CHILDRENS MULTIVITAMINS PO) Take 1 tablet by mouth daily.   Past Week at Unknown time    Review of Systems  Constitutional: Negative.   All other systems reviewed and are negative.   Blood pressure 114/71, pulse 78, temperature 98.2 F (36.8 C), temperature source Oral, resp. rate 18, last menstrual period 08/31/2015, SpO2 100 %. Physical Exam  Nursing note and vitals reviewed. Constitutional: She is oriented to person, place, and time. She appears well-developed and well-nourished.  HENT:  Head: Normocephalic and atraumatic.  Neck: Normal range of motion. Neck supple.  Cardiovascular: Normal rate and regular rhythm.   Respiratory: Effort normal and breath sounds normal.  GI: Soft. Bowel sounds are normal.  Genitourinary: Vagina normal and uterus normal.  Musculoskeletal: Normal range of motion.  Neurological: She is alert and oriented to person, place, and time. She has normal reflexes.  Skin: Skin is warm and dry.  Psychiatric: She has a normal mood and affect.    No results found for this or any previous visit (from the past 24 hour(s)).  No results found.  Assessment/Plan: AUB- structural lesion Diag HS , D&C, Myosure Surgical risks vs benefits  discussed. Consent done.  Lindsay Reynolds J 09/08/2015, 8:28 AM

## 2015-09-08 NOTE — Anesthesia Procedure Notes (Signed)
Procedure Name: LMA Insertion Date/Time: 09/08/2015 9:09 AM Performed by: Flossie Dibble Pre-anesthesia Checklist: Patient identified, Timeout performed, Emergency Drugs available, Suction available and Patient being monitored Patient Re-evaluated:Patient Re-evaluated prior to inductionOxygen Delivery Method: Circle system utilized Preoxygenation: Pre-oxygenation with 100% oxygen Intubation Type: IV induction LMA: LMA inserted LMA Size: 3.0 Number of attempts: 1 Placement Confirmation: positive ETCO2 and breath sounds checked- equal and bilateral Tube secured with: Tape Dental Injury: Teeth and Oropharynx as per pre-operative assessment

## 2015-09-08 NOTE — Op Note (Signed)
09/08/2015  9:39 AM  PATIENT:  Lindsay Reynolds  45 y.o. female   PRE-OPERATIVE DIAGNOSIS:  Abnormal Uterine Bleeding  POST-OPERATIVE DIAGNOSIS:  abnormal uterine bleeding Endometrial polyp  PROCEDURE:  Procedure(s): DILATATION & CURETTAGE HYSTEROSCOPY WITH MYOSURE  SURGEON:  Surgeon(s): Brien Few, MD  ASSISTANTS: none   ANESTHESIA:   local and general  ESTIMATED BLOOD LOSS: minimal  DRAINS: none   LOCAL MEDICATIONS USED:  MARCAINE    and Amount: 20 ml  SPECIMEN:  Source of Specimen:  [facility]  DISPOSITION OF SPECIMEN:  PATHOLOGY  COUNTS:  YES  DICTATION #UT:[id number]  PLAN OF CARE: dc home  PATIENT DISPOSITION:  PACU - hemodynamically stable.

## 2015-09-08 NOTE — Transfer of Care (Signed)
Immediate Anesthesia Transfer of Care Note  Patient: Lindsay Reynolds  Procedure(s) Performed: Procedure(s) with comments: DILATATION & CURETTAGE/HYSTEROSCOPY WITH MYOSURE (N/A) - 1 hr. requested  Patient Location: PACU  Anesthesia Type:General  Level of Consciousness: awake, alert  and oriented  Airway & Oxygen Therapy: Patient Spontanous Breathing and Patient connected to nasal cannula oxygen  Post-op Assessment: Report given to RN and Post -op Vital signs reviewed and stable  Post vital signs: Reviewed and stable  Last Vitals:  Vitals:   09/08/15 1054 09/08/15 1130  BP:  (!) 96/53  Pulse: (!) 57 (!) 57  Resp: 16 16  Temp: 37.1 C     Last Pain:  Vitals:   09/08/15 1054  TempSrc: Oral      Patients Stated Pain Goal: 2 ([id number])  Complications: No apparent anesthesia complications

## 2015-09-08 NOTE — Discharge Instructions (Signed)

## 2015-09-09 ENCOUNTER — Encounter (HOSPITAL_COMMUNITY): Payer: Self-pay | Admitting: Obstetrics and Gynecology

## 2015-09-09 NOTE — Op Note (Signed)
[name], Lindsay Reynolds NO.:  [id number]  MEDICAL RECORD NO.:  [id number]  LOCATION:  [facility]                          FACILITY:  [facility]  PHYSICIAN:  Lovenia Kim, M.D.DATE OF BIRTH:  [birth year]  DATE OF PROCEDURE: DATE OF DISCHARGE:  09/08/2015                              OPERATIVE REPORT   PREOPERATIVE DIAGNOSIS:  Abnormal uterine bleeding with structural lesion.  POSTOPERATIVE DIAGNOSIS:  Abnormal uterine bleeding with structural lesion plus endometrial polyp.  PROCEDURE:  Diagnostic hysteroscopy, D and C, MyoSure resection of endometrial polyp.  SURGEON:  Lovenia Kim, M.D.  ASSISTANT:  None.  ANESTHESIA:  General and local.  ESTIMATED BLOOD LOSS:  Less than 50 mL.  FLUID DEFICIT:  Less than 100 mL.  COMPLICATIONS:  None.  DRAINS:  None.  COUNTS:  Correct.  DISPOSITION:  The patient to recovery room in good condition.  BRIEF OPERATIVE NOTE:  After being apprised of risks of anesthesia, infection, bleeding, injury to surrounding organs, possible need for repair, delayed versus immediate complications to include bowel and bladder injury, possible need for repair.  The patient was brought to the operating room where she was administered general anesthetic without complications.  Prepped and draped in usual sterile fashion. Catheterized until the bladder was empty.  Exam under anesthesia reveals an acutely anteflexed uterus.  No adnexal masses appreciated.  Dilute vasopressin solution placed at 3 and 9 o'clock in a standard fashion. Dilute Marcaine solution placed in a paracervical block in a standard fashion, 20 mL total.  Cervix easily dilated up to 21 Pratt dilator. Hysteroscope with MyoSure placed.  Visualization reveals anterior wall and posterolateral wall fundal defect which were all resected using MyoSure device without difficulty.  No evidence of perforation noted.  D and C performed in 4-quadrant method.  No complications noted.   All instruments removed.  Procedure tolerated well.  The patient was awakened and transferred to recovery in good condition.  Tissue specimen to Pathology.     Lovenia Kim, M.D.     RJT/MEDQ  D:  09/08/2015  T:  09/09/2015  Job:  [id number]  cc:   Lovenia Kim, M.D. Fax: [phone]

## 2015-09-13 ENCOUNTER — Ambulatory Visit: Payer: BC Managed Care – PPO | Admitting: Hematology

## 2015-09-22 ENCOUNTER — Other Ambulatory Visit: Payer: Self-pay | Admitting: Family Medicine

## 2015-09-22 ENCOUNTER — Ambulatory Visit
Admission: RE | Admit: 2015-09-22 | Discharge: 2015-09-22 | Disposition: A | Discharge status: Home / Self Care | Payer: BC Managed Care – PPO | Source: Ambulatory Visit | Attending: Family Medicine | Admitting: Family Medicine

## 2015-09-22 DIAGNOSIS — R109 Unspecified abdominal pain: Secondary | ICD-10-CM

## 2015-09-30 ENCOUNTER — Encounter (HOSPITAL_COMMUNITY): Payer: Self-pay | Admitting: Obstetrics and Gynecology

## 2015-10-01 NOTE — Anesthesia Postprocedure Evaluation (Signed)
Anesthesia Post Note  Patient: Lindsay Reynolds  Procedure(s) Performed: Procedure(s) (LRB): DILATATION & CURETTAGE/HYSTEROSCOPY WITH MYOSURE (N/A)  Patient location during evaluation: PACU Anesthesia Type: General Level of consciousness: sedated Pain management: satisfactory to patient Vital Signs Assessment: post-procedure vital signs reviewed and stable Respiratory status: spontaneous breathing Cardiovascular status: stable Anesthetic complications: no     Last Vitals:  Vitals:   09/08/15 1054 09/08/15 1130  BP:  (!) 96/53  Pulse: (!) 57 (!) 57  Resp: 16 16  Temp: 37.1 C     Last Pain:  Vitals:   09/10/15 1038  TempSrc:   PainSc: 3    Pain Goal: Patients Stated Pain Goal: 2 (09/08/15 0742)               Lyndle Herrlich EDWARD

## 2015-10-04 ENCOUNTER — Other Ambulatory Visit: Payer: Self-pay | Admitting: Surgery

## 2015-10-05 NOTE — Progress Notes (Signed)
Please contact patient and notify of benign pathology results.  Jaxie Racanelli M. Elani Delph, MD, FACS [facility] Office: [phone]   

## 2015-10-19 ENCOUNTER — Ambulatory Visit (INDEPENDENT_AMBULATORY_CARE_PROVIDER_SITE_OTHER): Payer: BC Managed Care – PPO | Admitting: Oncology

## 2015-10-19 ENCOUNTER — Encounter: Payer: Self-pay | Admitting: Oncology

## 2015-10-19 VITALS — BP 116/70 | HR 56 | Temp 97.8°F | Ht 65.5 in | Wt 108.6 lb

## 2015-10-19 DIAGNOSIS — D509 Iron deficiency anemia, unspecified: Secondary | ICD-10-CM | POA: Diagnosis not present

## 2015-10-19 DIAGNOSIS — Z808 Family history of malignant neoplasm of other organs or systems: Secondary | ICD-10-CM

## 2015-10-19 DIAGNOSIS — Z803 Family history of malignant neoplasm of breast: Secondary | ICD-10-CM

## 2015-10-19 DIAGNOSIS — M2550 Pain in unspecified joint: Secondary | ICD-10-CM

## 2015-10-19 DIAGNOSIS — Z8249 Family history of ischemic heart disease and other diseases of the circulatory system: Secondary | ICD-10-CM

## 2015-10-19 DIAGNOSIS — D500 Iron deficiency anemia secondary to blood loss (chronic): Secondary | ICD-10-CM

## 2015-10-19 DIAGNOSIS — R6889 Other general symptoms and signs: Secondary | ICD-10-CM | POA: Diagnosis not present

## 2015-10-19 DIAGNOSIS — Z8041 Family history of malignant neoplasm of ovary: Secondary | ICD-10-CM

## 2015-10-19 DIAGNOSIS — D473 Essential (hemorrhagic) thrombocythemia: Secondary | ICD-10-CM | POA: Diagnosis not present

## 2015-10-19 DIAGNOSIS — Z8349 Family history of other endocrine, nutritional and metabolic diseases: Secondary | ICD-10-CM

## 2015-10-19 NOTE — Patient Instructions (Signed)
To lab today We will call you with results and send reports to Dr Tamala Julian Return visit with [facility] as needed

## 2015-10-20 LAB — CBC WITH DIFFERENTIAL/PLATELET
BASOS ABS: 0.1 10*3/uL (ref 0.0–0.2)
Basos: 1 %
EOS (ABSOLUTE): 0.1 10*3/uL (ref 0.0–0.4)
Eos: 1 %
HEMOGLOBIN: 14.3 g/dL (ref 11.1–15.9)
Hematocrit: 43.4 % (ref 34.0–46.6)
IMMATURE GRANS (ABS): 0 10*3/uL (ref 0.0–0.1)
IMMATURE GRANULOCYTES: 0 %
LYMPHS: 31 %
Lymphocytes Absolute: 1.3 10*3/uL (ref 0.7–3.1)
MCH: 27.8 pg (ref 26.6–33.0)
MCHC: 32.9 g/dL (ref 31.5–35.7)
MCV: 84 fL (ref 79–97)
MONOCYTES: 10 %
Monocytes Absolute: 0.4 10*3/uL (ref 0.1–0.9)
NEUTROS ABS: 2.4 10*3/uL (ref 1.4–7.0)
NEUTROS PCT: 57 %
PLATELETS: 270 10*3/uL (ref 150–379)
RBC: 5.15 x10E6/uL (ref 3.77–5.28)
RDW: 17.3 % — ABNORMAL HIGH (ref 12.3–15.4)
WBC: 4.2 10*3/uL (ref 3.4–10.8)

## 2015-10-20 LAB — C-REACTIVE PROTEIN: CRP: <0.3 mg/L (ref 0.0–4.9)

## 2015-10-20 LAB — IRON AND TIBC
IRON: 42 ug/dL (ref 27–159)
Iron Saturation: 12 % — ABNORMAL LOW (ref 15–55)
Total Iron Binding Capacity: 363 ug/dL (ref 250–450)
UIBC: 321 ug/dL (ref 131–425)

## 2015-10-20 LAB — SEDIMENTATION RATE: SED RATE: 3 mm/h (ref 0–32)

## 2015-10-20 LAB — FERRITIN: Ferritin: 20 ng/mL (ref 15–150)

## 2015-10-20 NOTE — Progress Notes (Signed)
New Patient Hematology   Lindsay Reynolds [id number] 1970/04/08 45 y.o. 10/20/2015  CC: Dr Carol Ada   Reason for referral: Elevated platelets, low iron, patient concerned about lymphoma    HPI:  45 year old woman I saw for an office consultation with one follow-up visit in March 2008. She was postpartum with her first child. Due to some clot seen in the placenta and a MTHFR mutation on a hypercoagulable workup I was asked to comment. We have learned over the years from prospective studies that the MT HFR mutation is not associated with pregnancy complications or increased risk for blood clots except in the form that presents at childbirth with severe hyper homocystinemia. She has a healthy 43 year old girl. She has not become pregnant again.  In the summer of this year she developed a flulike illness characterized primarily by polyarthralgia and swelling of the joints of her hands but involving large joints as well including her knees. She had 3 weeks of fever up to 103.5. She had intermittent night sweats. She had right axillary adenopathy on exam. She had a mild transient rash in the bib area. She was found have a subcutaneous lesion on the right shoulder. She was caring for a neighbor's cat at the time but did not get scratched. She used gloves when she cleaned the litter. No tick exposure. No travel except to [place] and her symptoms started prior to that. She was initially prescribed a Z-Pak which did not impact on her symptoms. An extensive evaluation followed including infectious disease consultation at [facility] in [place]. She tested negative for acute mononucleosis but had significant elevation of IgG titers indicating prior exposure. Hepatitis profile negative. ANA negative. Rheumatoid factor negative. HIV negative. Toxoplasmosis titers negative. She had a mild anemia with hemoglobin 12 hematocrit 37. White count 11,300, 72% neutrophils, 13 lymphocytes, 15 monocytes,  platelet count elevated at 511,000, ESR 37 mm, repeat 43 mm, normal liver functions on 08/02/2015. Repeat lab on August 11 with hemoglobin 12, hematocrit 38, MCV 78, platelets 602,000, white count 5400 with 53% neutrophils, 33 lymphocytes, 10 monocytes, 1 basophil. Serum iron 25, percent saturation 6, TIBC 398, ferritin 31 with lab normal range 11-3 07. A daily iron supplement with multivitamins started at that time. A repeat CBC done on September 20 with hemoglobin 12.7, MCV 84, platelet count now normal 260,000. Normal white count with normal differential. Chest x-ray done on August 2 was normal. Regular abdominal x-rays done 09/22/2015 and a limited right upper quadrant abdominal ultrasound done on  01/13/2014 were normal. Spleen not assessed. Overall her symptoms have significantly improved. She is still getting some intermittent joint discomfort without any joint swelling. She has intermittent left flank discomfort. Her appetite is good. Her weight overall stable. She states her base weight is 112 pounds. She weighs 109 pounds today. She does admit to cold extremities but no cyanotic changes. No paresthesias. No further fevers. She had the skin lesion on her shoulder excised 2 weeks ago by Dr. Harlow Asa. Pathology showed a benign lipoma. The right axillary lymph node regressed completely. She has felt some small lymph nodes in her groin area.  She denies any history of hepatitis, yellow jaundice, malaria, or mononucleosis.  She recently had a DNC by Dr. Ronita Hipps on September 6 to control menorrhagia.   Both of her parents are still alive and well as are 2 sisters. No one with a hematologic or rheumatologic disorder.     PMH: Past Medical History:  Diagnosis Date  . Allergy   .  Anemia    iron deficient  . Anxiety    situational no meds  . Arthritis   . Clotting disorder ([place])    per pt the clotting disorder was noted when she was pregnant 12 years ago- and no problem unless she is pregnant.  .  Family history of adverse reaction to anesthesia    mother PONV  . 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 MTHFR (methylene THF reductase) deficiency and homocystinuria ([place])   . SVD (spontaneous vaginal delivery)    x 1  . Uterus bicornis bicollus     Past Surgical History:  Procedure Laterality Date  . BREAST LUMPECTOMY Left   . COLONOSCOPY  08/2015  . DILATATION & CURETTAGE/HYSTEROSCOPY WITH MYOSURE N/A 09/08/2015   Procedure: DILATATION & CURETTAGE/HYSTEROSCOPY WITH MYOSURE;  Surgeon: Brien Few, MD;  Location: [facility];  Service: Gynecology;  Laterality: N/A;  1 hr. requested  . EYE SURGERY Bilateral 02/2011   Lasik   . EYE SURGERY      Allergies: No Known Allergies  Medications:  Current Outpatient Prescriptions:  .  Docosahexaenoic Acid (DHA OMEGA 3 PO), Take 1-2 capsules by mouth daily., Disp: , Rfl:  .  IRON PO, Take 1 tablet by mouth daily., Disp: , Rfl:  .  Pediatric Multiple Vitamins (CHILDRENS MULTIVITAMINS PO), Take 1 tablet by mouth daily., Disp: , Rfl:   Social History: Married. Healthy daughter age 103.  reports that she has never smoked. She has never used smokeless tobacco. She reports that she drinks about 0.6 oz of alcohol per week . She reports that she does not use drugs.  Family History: Family History  Problem Relation Age of Onset  . Breast cancer Other   . Cancer Mother     skin  . Hyperlipidemia Mother   . Hyperlipidemia Father   . Ovarian cancer Paternal Grandmother   . Cancer Paternal Grandmother     ovarian  . Stroke Paternal Grandfather   2 sisters alive & well; 1 sister deceased: suicide  Review of Systems:  See HPI She denies any headache or change in vision. No cough, dyspnea, chest pain, palpitations, dysphagia,. No change in bowel habit. No hematochezia or melena. No hematuria. Remaining ROS negative.  Physical Exam: Blood pressure 116/70, pulse (!) 56, temperature 97.8 F (36.6 C), temperature source Oral, height 5' 5.5"  (1.664 m), weight 108 lb 9.6 oz (49.3 kg), SpO2 100 %. Wt Readings from Last 3 Encounters:  10/19/15 108 lb 9.6 oz (49.3 kg)  09/02/15 109 lb (49.4 kg)  04/11/12 117 lb (53.1 kg)     General appearance: Very thin but adequately nourished  Caucasian woman HENNT: Pharynx no erythema, exudate, mass, or ulcer. No thyromegaly or thyroid nodules Lymph nodes: No cervical, supraclavicular, or axillary lymphadenopathy Breasts: Not examined Lungs: Clear to auscultation, resonant to percussion throughout Heart: Regular rhythm, no murmur, no gallop, no rub, no click, no edema Abdomen: Soft, mild tenderness in the left upper quadrant, normal bowel sounds, no mass, no organomegaly. No splenomegaly appreciated even with the patient in the right lateral decubitus position. Extremities: No edema, no calf tenderness Musculoskeletal: no joint deformities GU: Tiny pea-sized lymph nodes along the inguinal ligament bilaterally, nonpathologic. Vascular: Carotid pulses 2+, no bruits, distal pulses: Dorsalis pedis 1+ symmetric Neurologic: Alert, oriented, PERRLA, optic discs sharp and vessels normal, no hemorrhage or exudate, cranial nerves grossly normal, motor strength 5 over 5, reflexes 1+ symmetric, upper body coordination normal, gait normal, Skin: No rash or ecchymosis  Lab Results: Lab Results  Component Value Date   WBC 4.2 10/19/2015   HGB 12.1 09/02/2015   HCT 43.4 10/19/2015   MCV 84 10/19/2015   PLT 270 10/19/2015     Chemistry      Component Value Date/Time   NA 139 04/11/2012 0927   K 4.6 04/11/2012 0927   CL 105 04/11/2012 0927   CO2 24 04/11/2012 0927   BUN 11 04/11/2012 0927   CREATININE 0.55 04/11/2012 0927      Component Value Date/Time   CALCIUM 9.6 04/11/2012 0927   ALKPHOS 41 04/11/2012 0927   AST 20 04/11/2012 0927   ALT 16 04/11/2012 0927   BILITOT 0.9 04/11/2012 0927    Hemoglobin 14, hematocrit 43, white count 4200 with 57% neutrophils, 31 lymphocytes, 10  monocytes, 1 eosinophil, 1 basophil, labeled count 270,000 ESR 3 mm, CRP less than 0.3, Iron 42, TIBC 363, percent saturation 12, Ferritin 20 with lab normal 15-150  Radiological Studies: See discussion above    Impression: #1. Iron deficiency anemia: Resolved with adequate oral iron replacement  #2. Reactive thrombocytosis secondary to iron deficiency: resolved.  #3. Atypical febrile illness, likely viral, characterized by fever and polymyalgia. Signs and symptoms now almost completely resolved. Some residual intermittent joint stiffness. Fevers have resolved. Night sweats have resolved. No progressive weight loss or anorexia. No pathologic adenopathy on exam. No splenomegaly. No clinical suspicion for lymphoma at this time.  #4. Tiny, symmetric, nonpathologic size lymph nodes in the groin.       Annia Belt, MD 10/20/2015, 9:53 AM

## 2015-11-19 ENCOUNTER — Other Ambulatory Visit: Payer: Self-pay | Admitting: Obstetrics and Gynecology

## 2015-11-19 DIAGNOSIS — Z1231 Encounter for screening mammogram for malignant neoplasm of breast: Secondary | ICD-10-CM

## 2015-12-22 ENCOUNTER — Ambulatory Visit
Admission: RE | Admit: 2015-12-22 | Discharge: 2015-12-22 | Disposition: A | Discharge status: Home / Self Care | Payer: BC Managed Care – PPO | Source: Ambulatory Visit | Attending: Obstetrics and Gynecology | Admitting: Obstetrics and Gynecology

## 2015-12-22 DIAGNOSIS — Z1231 Encounter for screening mammogram for malignant neoplasm of breast: Secondary | ICD-10-CM

## 2016-05-23 ENCOUNTER — Other Ambulatory Visit: Payer: Self-pay | Admitting: Gastroenterology

## 2016-05-23 DIAGNOSIS — R10812 Left upper quadrant abdominal tenderness: Secondary | ICD-10-CM

## 2016-05-25 ENCOUNTER — Ambulatory Visit
Admission: RE | Admit: 2016-05-25 | Discharge: 2016-05-25 | Disposition: A | Discharge status: Home / Self Care | Payer: BC Managed Care – PPO | Source: Ambulatory Visit | Attending: Gastroenterology | Admitting: Gastroenterology

## 2016-05-25 DIAGNOSIS — R10812 Left upper quadrant abdominal tenderness: Secondary | ICD-10-CM

## 2016-06-01 ENCOUNTER — Other Ambulatory Visit: Payer: BC Managed Care – PPO

## 2016-07-02 DIAGNOSIS — N63 Unspecified lump in unspecified breast: Secondary | ICD-10-CM

## 2016-07-02 HISTORY — DX: Unspecified lump in unspecified breast: N63.0

## 2016-07-04 ENCOUNTER — Other Ambulatory Visit: Payer: Self-pay | Admitting: Orthopedic Surgery

## 2016-07-04 DIAGNOSIS — R0781 Pleurodynia: Secondary | ICD-10-CM

## 2016-07-06 ENCOUNTER — Other Ambulatory Visit: Payer: Self-pay | Admitting: Obstetrics and Gynecology

## 2016-07-06 DIAGNOSIS — N644 Mastodynia: Secondary | ICD-10-CM

## 2016-07-06 DIAGNOSIS — N63 Unspecified lump in unspecified breast: Secondary | ICD-10-CM

## 2016-07-07 ENCOUNTER — Ambulatory Visit
Admission: RE | Admit: 2016-07-07 | Discharge: 2016-07-07 | Disposition: A | Discharge status: Home / Self Care | Payer: BC Managed Care – PPO | Source: Ambulatory Visit | Attending: Orthopedic Surgery | Admitting: Orthopedic Surgery

## 2016-07-07 DIAGNOSIS — R0781 Pleurodynia: Secondary | ICD-10-CM

## 2016-07-13 ENCOUNTER — Ambulatory Visit
Admission: RE | Admit: 2016-07-13 | Discharge: 2016-07-13 | Disposition: A | Discharge status: Home / Self Care | Payer: BC Managed Care – PPO | Source: Ambulatory Visit | Attending: Obstetrics and Gynecology | Admitting: Obstetrics and Gynecology

## 2016-07-13 DIAGNOSIS — N63 Unspecified lump in unspecified breast: Secondary | ICD-10-CM

## 2016-07-13 DIAGNOSIS — N644 Mastodynia: Secondary | ICD-10-CM

## 2016-07-13 HISTORY — DX: Unspecified lump in unspecified breast: N63.0

## 2017-01-09 ENCOUNTER — Other Ambulatory Visit: Payer: Self-pay | Admitting: Obstetrics and Gynecology

## 2017-01-09 DIAGNOSIS — Z1231 Encounter for screening mammogram for malignant neoplasm of breast: Secondary | ICD-10-CM

## 2017-01-31 ENCOUNTER — Ambulatory Visit: Payer: BC Managed Care – PPO

## 2017-02-16 ENCOUNTER — Ambulatory Visit: Payer: BC Managed Care – PPO

## 2017-03-06 ENCOUNTER — Ambulatory Visit
Admission: RE | Admit: 2017-03-06 | Discharge: 2017-03-06 | Disposition: A | Discharge status: Home / Self Care | Payer: BC Managed Care – PPO | Source: Ambulatory Visit | Attending: Obstetrics and Gynecology | Admitting: Obstetrics and Gynecology

## 2017-03-06 DIAGNOSIS — Z1231 Encounter for screening mammogram for malignant neoplasm of breast: Secondary | ICD-10-CM

## 2017-11-21 ENCOUNTER — Ambulatory Visit (HOSPITAL_COMMUNITY)
Admission: RE | Admit: 2017-11-21 | Discharge: 2017-11-21 | Disposition: A | Discharge status: Home / Self Care | Payer: BC Managed Care – PPO | Source: Ambulatory Visit | Attending: Orthopaedic Surgery | Admitting: Orthopaedic Surgery

## 2017-11-21 ENCOUNTER — Other Ambulatory Visit (HOSPITAL_COMMUNITY): Payer: Self-pay | Admitting: Orthopaedic Surgery

## 2017-11-21 ENCOUNTER — Other Ambulatory Visit: Payer: Self-pay | Admitting: Orthopaedic Surgery

## 2017-11-21 DIAGNOSIS — M7989 Other specified soft tissue disorders: Principal | ICD-10-CM

## 2017-11-21 DIAGNOSIS — M79605 Pain in left leg: Secondary | ICD-10-CM

## 2017-11-21 NOTE — Progress Notes (Signed)
LLE venous duplex prelim: negative for DVT.  Landry Mellow, RDMS, RVT    Attempted call report to Dr. Lucia Gaskins, when pressing option 2 for physician or hospital call, the line cuts off. Will let patient leave since negative.

## 2017-11-22 ENCOUNTER — Other Ambulatory Visit: Payer: BC Managed Care – PPO

## 2017-11-22 ENCOUNTER — Ambulatory Visit (HOSPITAL_COMMUNITY): Payer: BC Managed Care – PPO

## 2017-12-12 ENCOUNTER — Other Ambulatory Visit: Payer: Self-pay | Admitting: Obstetrics and Gynecology

## 2017-12-12 DIAGNOSIS — Z1231 Encounter for screening mammogram for malignant neoplasm of breast: Secondary | ICD-10-CM

## 2017-12-20 ENCOUNTER — Ambulatory Visit (HOSPITAL_COMMUNITY)
Admission: RE | Admit: 2017-12-20 | Discharge: 2017-12-20 | Disposition: A | Discharge status: Home / Self Care | Payer: BC Managed Care – PPO | Source: Ambulatory Visit | Attending: Physician Assistant | Admitting: Physician Assistant

## 2017-12-20 ENCOUNTER — Other Ambulatory Visit (HOSPITAL_COMMUNITY): Payer: Self-pay | Admitting: Orthopaedic Surgery

## 2017-12-20 DIAGNOSIS — I872 Venous insufficiency (chronic) (peripheral): Secondary | ICD-10-CM

## 2018-01-28 ENCOUNTER — Other Ambulatory Visit: Payer: Self-pay

## 2018-01-28 ENCOUNTER — Encounter: Payer: Self-pay | Admitting: Surgery

## 2018-01-28 ENCOUNTER — Ambulatory Visit: Payer: BC Managed Care – PPO | Admitting: Surgery

## 2018-01-28 VITALS — BP 105/71 | HR 65 | Temp 97.9°F | Resp 18 | Ht 65.5 in | Wt 114.0 lb

## 2018-01-28 DIAGNOSIS — I872 Venous insufficiency (chronic) (peripheral): Secondary | ICD-10-CM

## 2018-01-28 NOTE — Progress Notes (Signed)
[facility]  Patient name: Lindsay Reynolds MRN: [id number] DOB: 1970-04-23 Sex: female   REQUESTING PROVIDER:    Dr. Lucia Gaskins   REASON FOR CONSULT:    venous insufficiency  HISTORY OF PRESENT ILLNESS:   Lindsay Reynolds is a 48 y.o. female, who is referred for possible venous insufficiency.  The patient states that back in April 2019 she twisted her left ankle.  She self treated this.  Over the summer with walking she noticed that her ankle would become discolored and purple.  She did not have any pain with this.  In the fall 2019, she felt like she was having shin splints and ankle swelling.  An x-ray at that time was negative.  She has tried compression stockings as well as a brace.  She states that she gets pain and swelling of the lateral lower part of her leg.  She is also noticed a prominence which is tender up around her medial knee region.  Compression stockings caused her some pain and discomfort.  She denies any history of DVT or clotting disorders in the past.  Prior to her injury, she never had issues with ankle swelling.  PAST MEDICAL HISTORY    Past Medical History:  Diagnosis Date  . Allergy   . Anemia    iron deficient  . Anxiety    situational no meds  . Arthritis   . Breast mass 07/02/2016   pt feels lt breast lump for 2 weeks with swelling   . Clotting disorder ([place])    per pt the clotting disorder was noted when she was pregnant 12 years ago- and no problem unless she is pregnant.  . Family history of adverse reaction to anesthesia    mother PONV  . 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 MTHFR (methylene THF reductase) deficiency and homocystinuria ([place])   . SVD (spontaneous vaginal delivery)    x 1  . Uterus bicornis bicollus      FAMILY HISTORY   Family History  Problem Relation Age of Onset  . Breast cancer Other   . Cancer Mother        skin  . Hyperlipidemia Mother   .  Hyperlipidemia Father   . Ovarian cancer Paternal Grandmother   . Cancer Paternal Grandmother        ovarian  . Stroke Paternal Grandfather     SOCIAL HISTORY:   Social History   Socioeconomic History  . Marital status: Married    Spouse name: Not on file  . Number of children: Not on file  . Years of education: Not on file  . Highest education level: Not on file  Occupational History  . Not on file  Social Needs  . Financial resource strain: Not on file  . Food insecurity:    Worry: Not on file    Inability: Not on file  . Transportation needs:    Medical: Not on file    Non-medical: Not on file  Tobacco Use  . Smoking status: Never Smoker  . Smokeless tobacco: Never Used  Substance and Sexual Activity  . Alcohol use: Yes    Alcohol/week: 1.0 standard drinks    Types: 1 Glasses of wine per week  . Drug use: No  . Sexual activity: Yes    Birth control/protection: Condom  Lifestyle  . Physical activity:    Days per week: Not on file    Minutes per session: Not on file  .  Stress: Not on file  Relationships  . Social connections:    Talks on phone: Not on file    Gets together: Not on file    Attends religious service: Not on file    Active member of club or organization: Not on file    Attends meetings of clubs or organizations: Not on file    Relationship status: Not on file  . Intimate partner violence:    Fear of current or ex partner: Not on file    Emotionally abused: Not on file    Physically abused: Not on file    Forced sexual activity: Not on file  Other Topics Concern  . Not on file  Social History Narrative   ** Merged History Encounter **        ALLERGIES:    No Known Allergies  CURRENT MEDICATIONS:    Current Outpatient Medications  Medication Sig Dispense Refill  . Docosahexaenoic Acid (DHA OMEGA 3 PO) Take 1-2 capsules by mouth daily.    . IRON PO Take 1 tablet by mouth daily.    . Pediatric Multiple Vitamins (CHILDRENS  MULTIVITAMINS PO) Take 1 tablet by mouth daily.     No current facility-administered medications for this visit.     REVIEW OF SYSTEMS:   [X]  denotes positive finding, [ ]  denotes negative finding Cardiac  Comments:  Chest pain or chest pressure:    Shortness of breath upon exertion:    Short of breath when lying flat:    Irregular heart rhythm:        Vascular    Pain in calf, thigh, or hip brought on by ambulation:    Pain in feet at night that wakes you up from your sleep:     Blood clot in your veins:    Leg swelling:  x       Pulmonary    Oxygen at home:    Productive cough:     Wheezing:         Neurologic    Sudden weakness in arms or legs:     Sudden numbness in arms or legs:     Sudden onset of difficulty speaking or slurred speech:    Temporary loss of vision in one eye:     Problems with dizziness:         Gastrointestinal    Blood in stool:      Vomited blood:         Genitourinary    Burning when urinating:     Blood in urine:        Psychiatric    Major depression:         Hematologic    Bleeding problems:    Problems with blood clotting too easily:        Skin    Rashes or ulcers:        Constitutional    Fever or chills:     PHYSICAL EXAM:   There were no vitals filed for this visit.  GENERAL: The patient is a well-nourished female, in no acute distress. The vital signs are documented above. CARDIAC: There is a regular rate and rhythm.  VASCULAR: Palpable pedal pulses.  No appreciable swelling.  Tender small varix in the upper medial left calf PULMONARY: Nonlabored respirations MUSCULOSKELETAL: There are no major deformities or cyanosis. NEUROLOGIC: No focal weakness or paresthesias are detected. SKIN: There are no ulcers or rashes noted. PSYCHIATRIC: The patient has a normal affect.  STUDIES:  I have reviewed the following:  DVT Study: Left: There is no evidence of deep vein thrombosis in the lower extremity. No cystic  structure found in the popliteal fossa.   Reflux: Left: Abnormal reflux times were noted in the popliteal vein, great saphenous vein at the proximal thigh, great saphenous vein at the mid thigh, great saphenous vein at the distal thigh, great saphenous vein at the knee, great saphenous vein at the  proximal calf, and great saphenous vein at the mid calf. Findings consistent with age indeterminate superficial vein thrombosis involving the left small saphenous vein. No evidence of deep vein thrombosis in the common femoral, femoral, or popliteal  Veins. Vein Diameters: +------------------------------+----------+---------+                Right (cm)Left (cm) +------------------------------+----------+---------+ GSV at Saphenofemoral junction     0.72    +------------------------------+----------+---------+ GSV at prox thigh            0.40    +------------------------------+----------+---------+ GSV at mid thigh            0.36    +------------------------------+----------+---------+ GSV at distal thigh           0.45    +------------------------------+----------+---------+ GSV at knee               0.27    +------------------------------+----------+---------+ GSV prox calf              0.56    +------------------------------+----------+---------+ GSV mid calf              0.25    +------------------------------+----------+---------+ SSV origin               0.24    +------------------------------+----------+---------+ SSV prox                0.24    +------------------------------+----------+---------+ SSV mid                 NV     +------------------------------+----------+---------+ ASSESSMENT and PLAN   Venous insufficiency: The patient does have very mild reflux in her left  saphenous vein, as evidenced by the small diameters.  She was noted to have thrombus within her small saphenous vein.  I have recommended that she take a baby aspirin and wear compression stockings to treat her venous insufficiency.  Based on her vein diameters, she is not a candidate for laser ablation.  Left leg pain: I do not think that her venous insufficiency fully explains her symptoms.  I wonder if she is still having ankle issues secondary to her trauma last year.  I have recommended that she have this reevaluated by her orthopedist.   Annamarie Major, MD [facility] [phone] Pager [phone]

## 2018-03-27 ENCOUNTER — Ambulatory Visit: Payer: BC Managed Care – PPO

## 2018-03-29 IMAGING — CR DG CHEST 2V
2 series · 2 of 2 positions shown · non-contrast
Comparison: None.

CLINICAL DATA: One week history of cough and fever.  Nonsmoker.

EXAM:
CHEST  2 VIEW

[w chest pa]
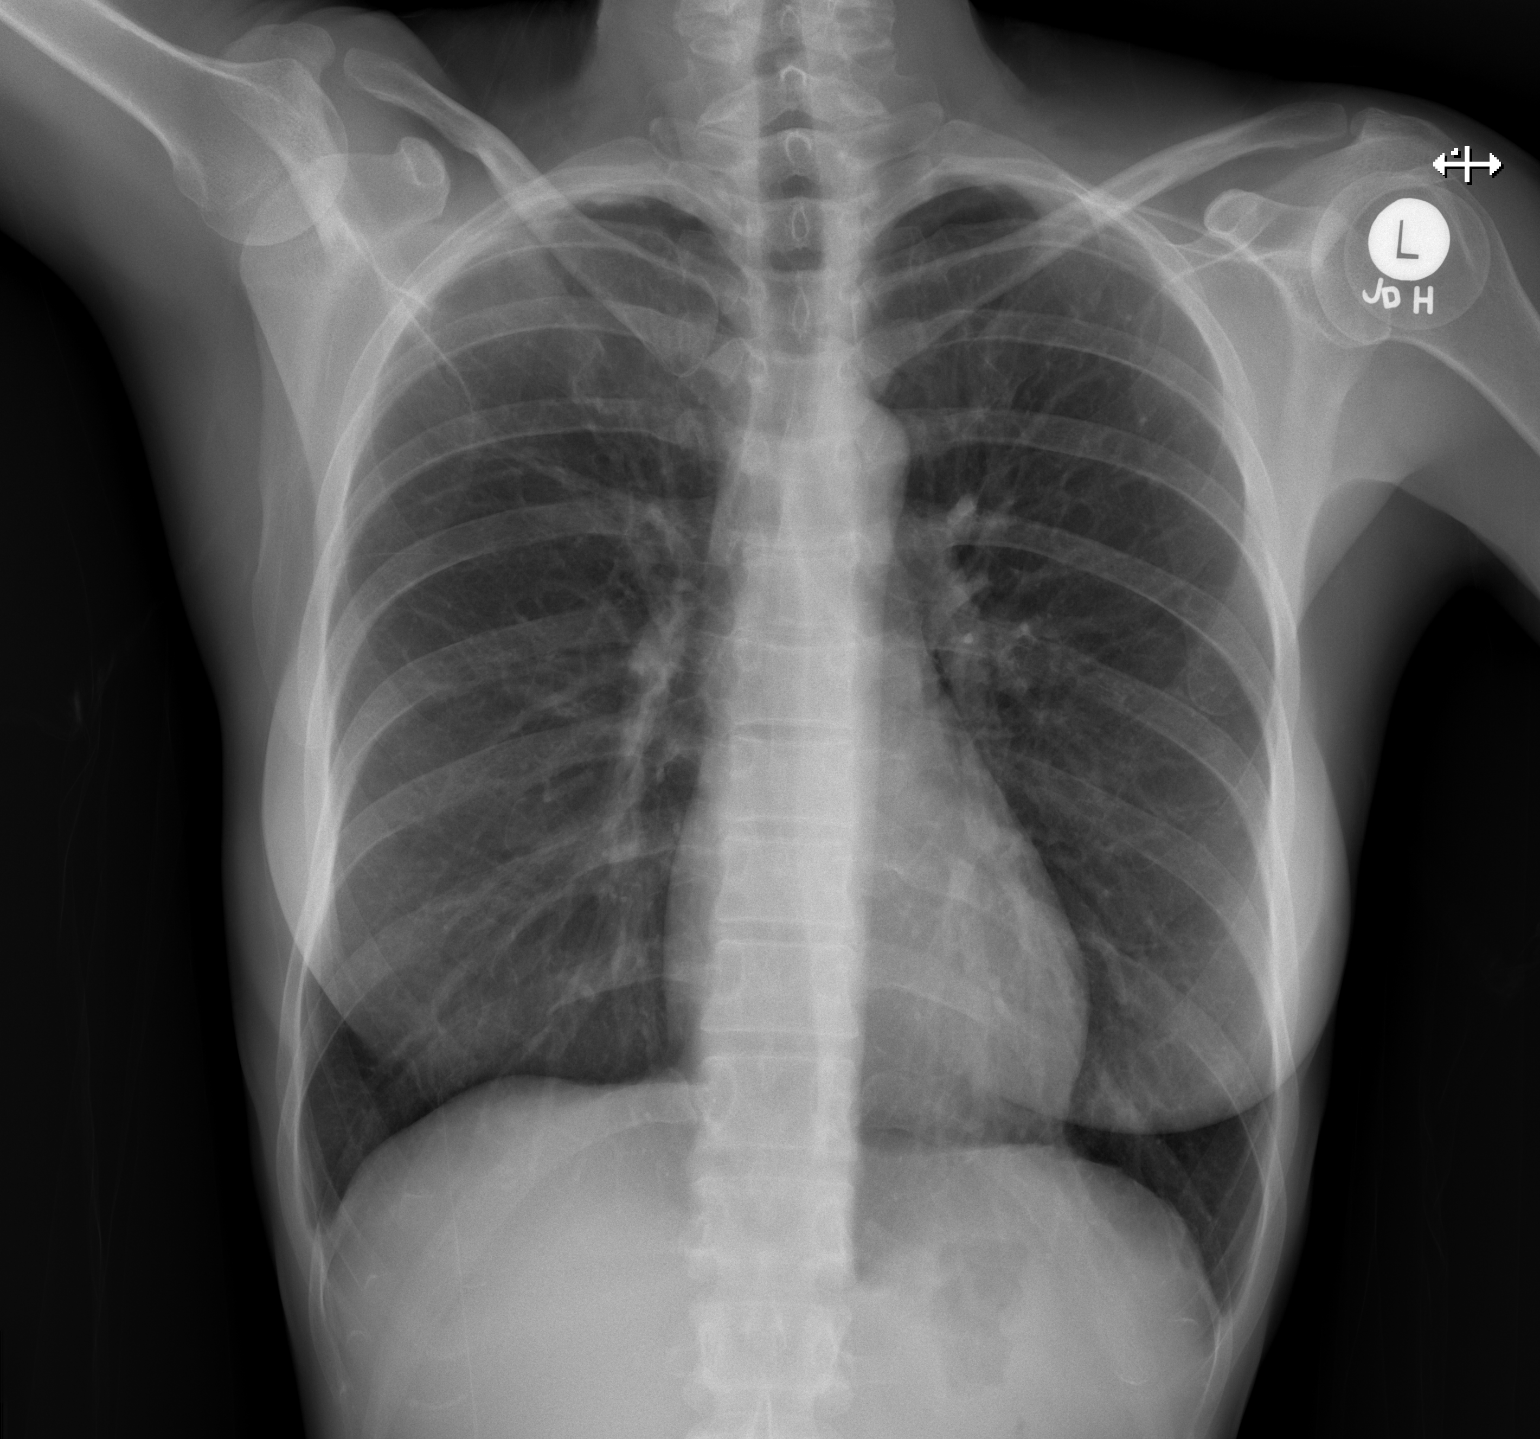

[w chest lat]
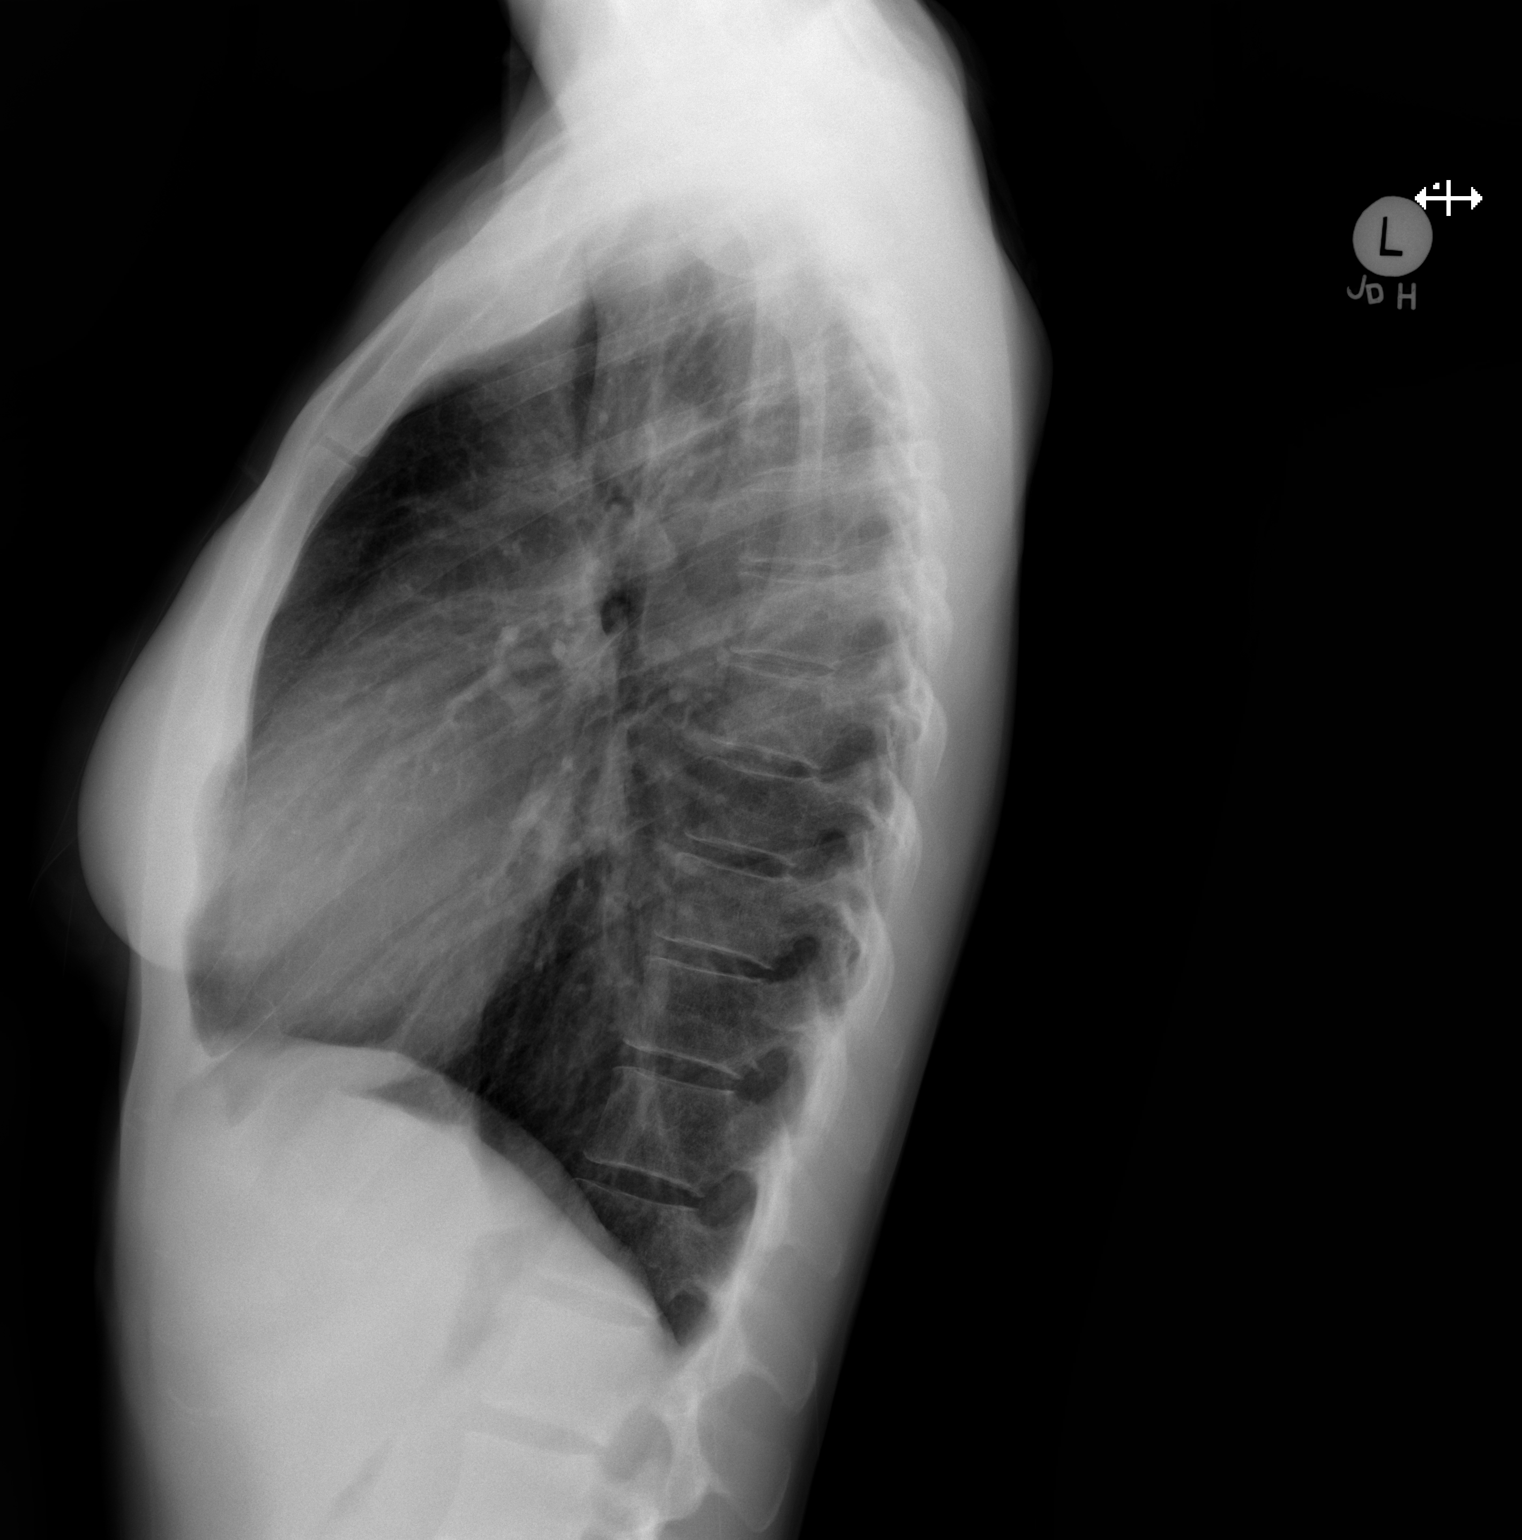

[2 of 2 positions shown; findings below may reference images not displayed]

FINDINGS: Cardiomediastinal silhouette unremarkable. Lungs clear.
Bronchovascular markings normal. Pulmonary vascularity normal. No
pneumothorax. No pleural effusions. Visualized bony thorax intact.
IMPRESSION: Normal examination.

## 2018-05-17 IMAGING — CR DG ABDOMEN 1V
1 series · 1 of 1 positions shown · non-contrast
Comparison: Abdominal ultrasound 11/16/2014

CLINICAL DATA: Left upper quadrant pain for 1 month.

EXAM:
ABDOMEN - 1 VIEW

[t abdomen supine]
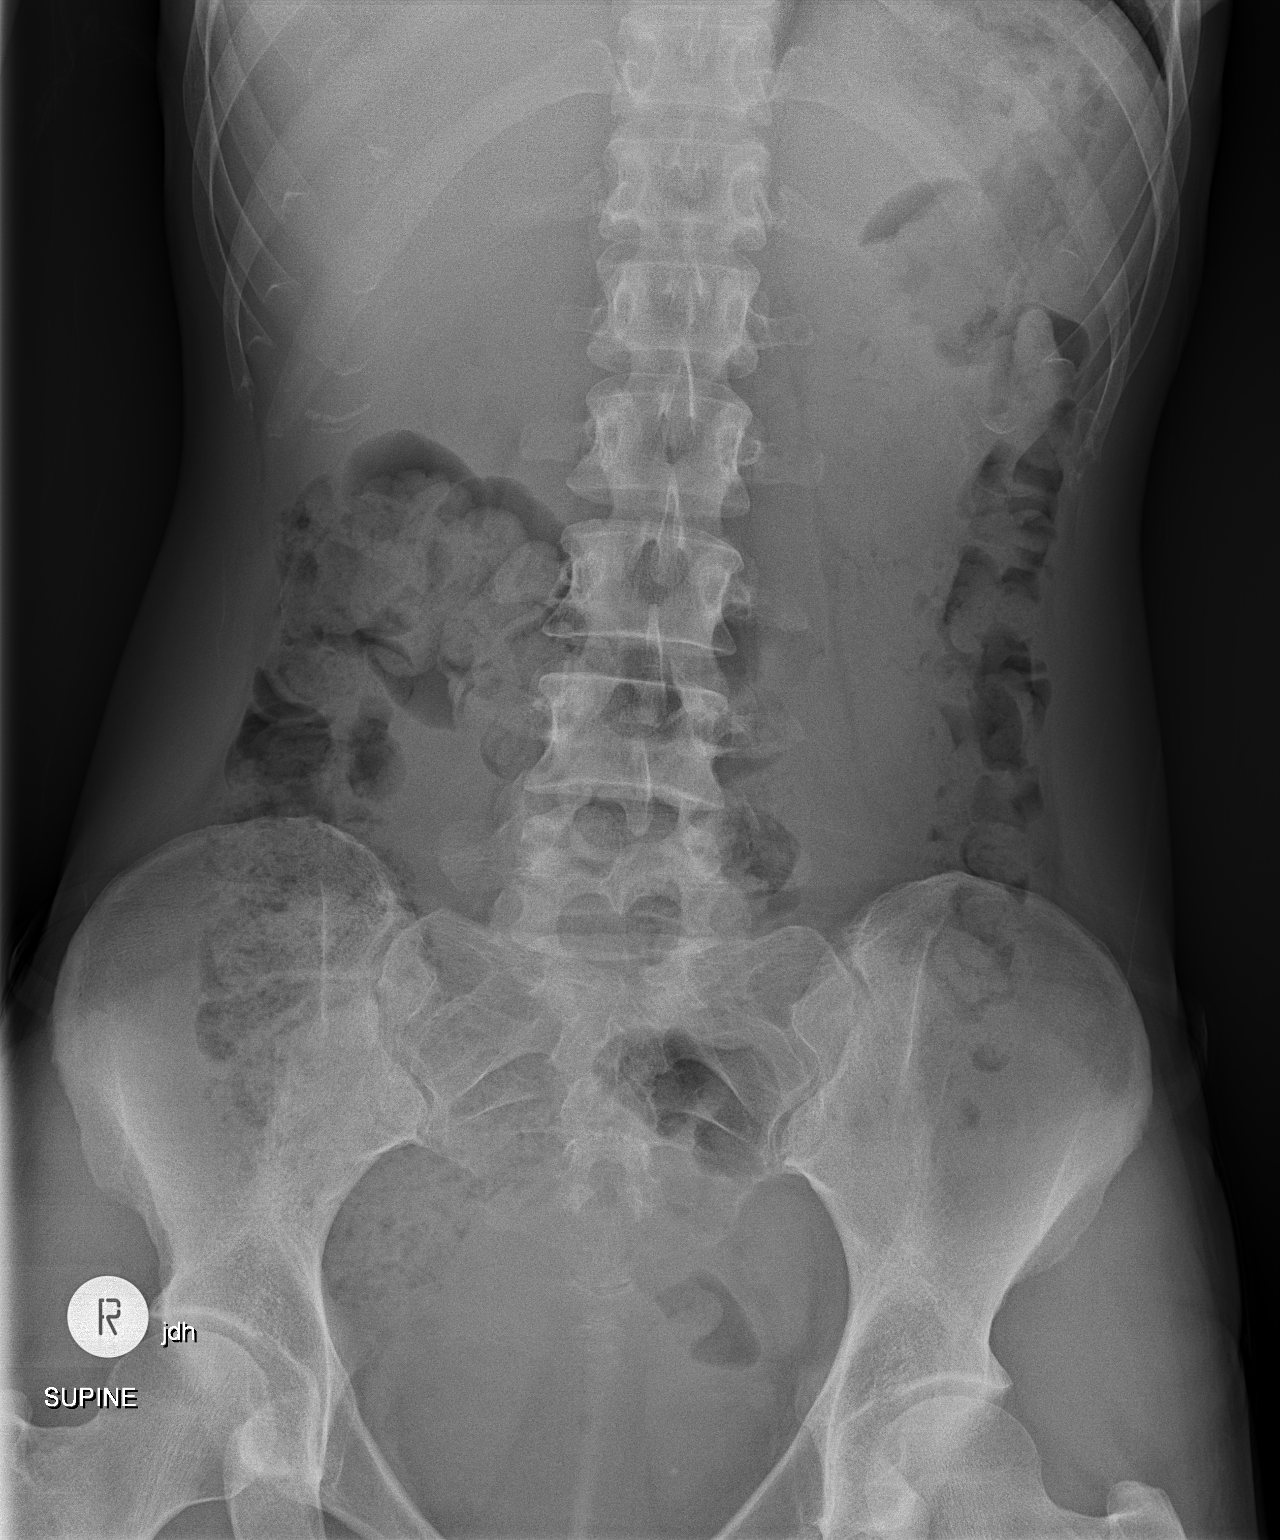

[1 of 1 positions shown; findings below may reference images not displayed]

FINDINGS: The bowel gas pattern is normal. No radio-opaque calculi or other
significant radiographic abnormality are seen.
IMPRESSION: No radiographic evidence of obstruction.

## 2019-01-06 ENCOUNTER — Ambulatory Visit: Payer: BC Managed Care – PPO | Attending: Internal Medicine

## 2019-01-23 IMAGING — US US ABDOMEN COMPLETE
1 series · 14 of 25 positions shown · non-contrast
Comparison: 11/16/2014 and abdomen radiograph dated 09/22/2015.

CLINICAL DATA: Left upper quadrant abdominal tenderness.

EXAM:
ABDOMEN ULTRASOUND COMPLETE

[Series 1: us abdomen complete · 0.20mm/px · 14 of 65 slices shown]
[im 1/65]
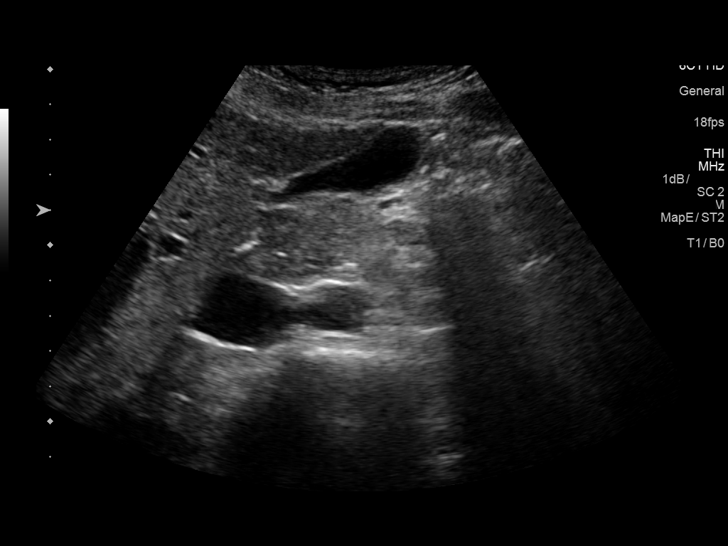
[im 6/65]
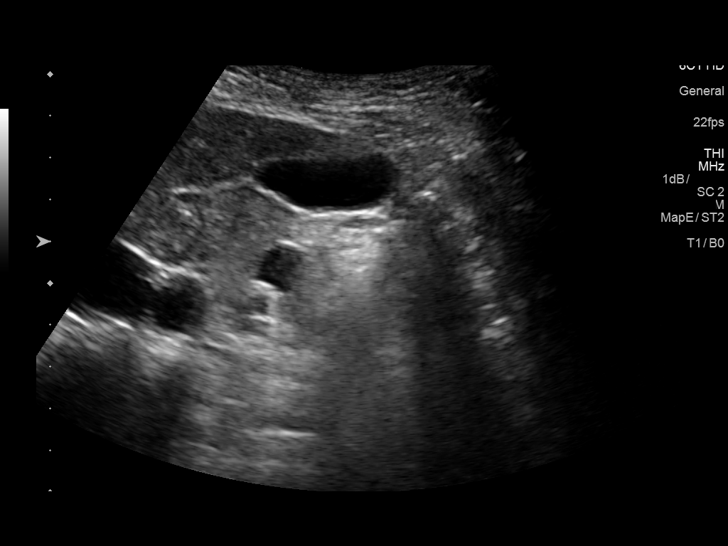
[im 11/65]
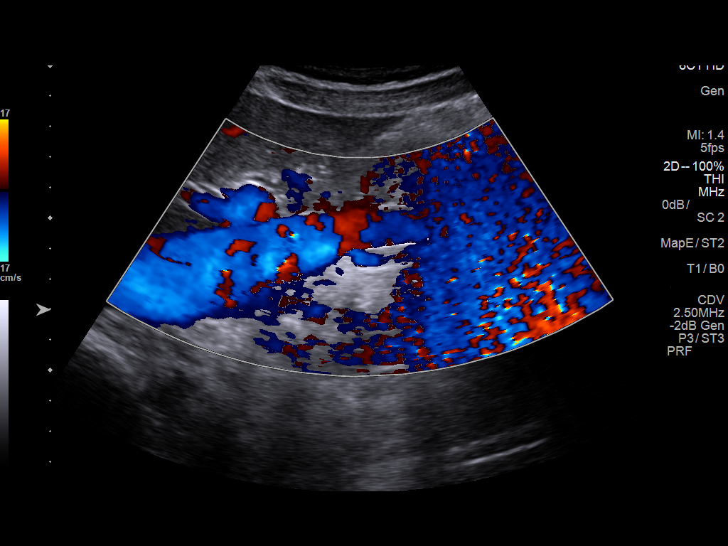
[im 17/65]
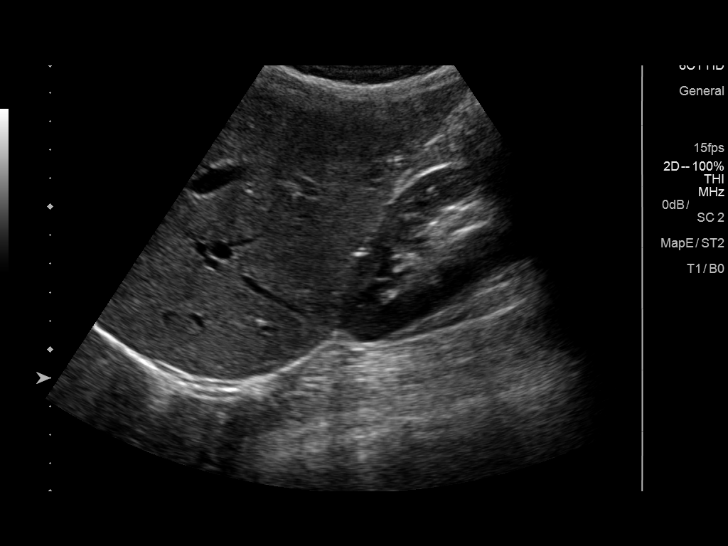
[im 22/65]
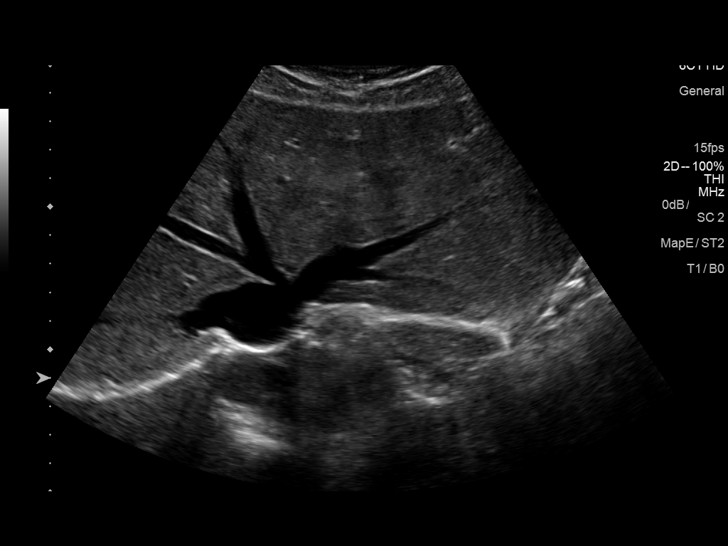
[im 25/65]
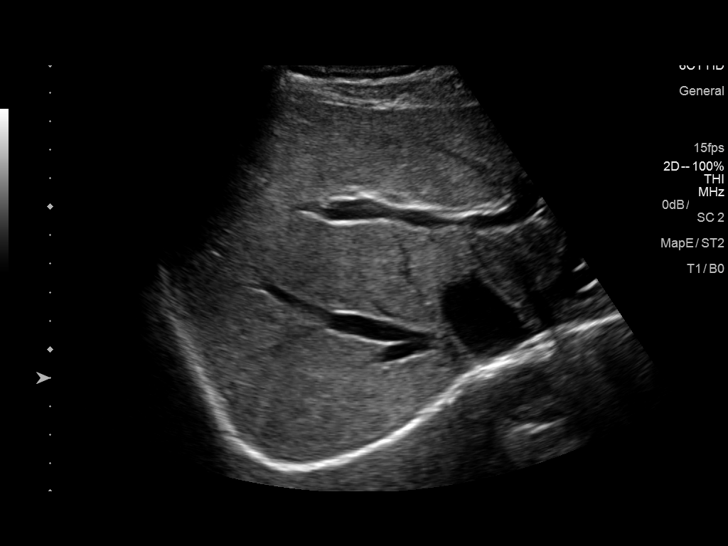
[im 30/65]
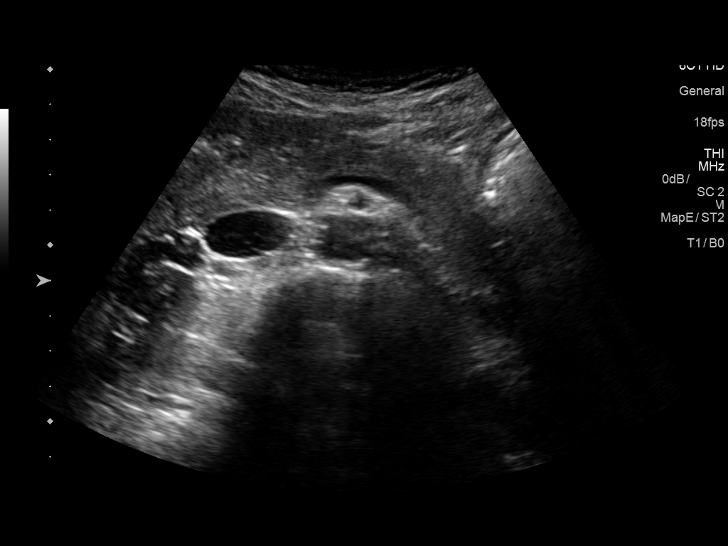
[im 35/65]
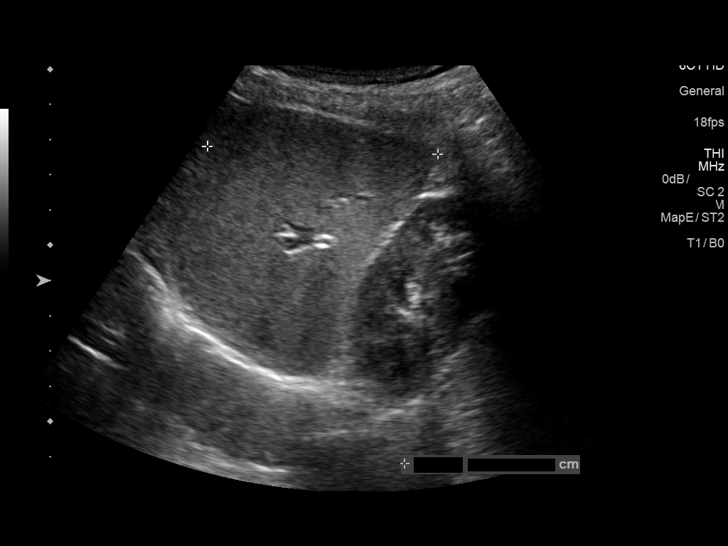
[im 41/65]
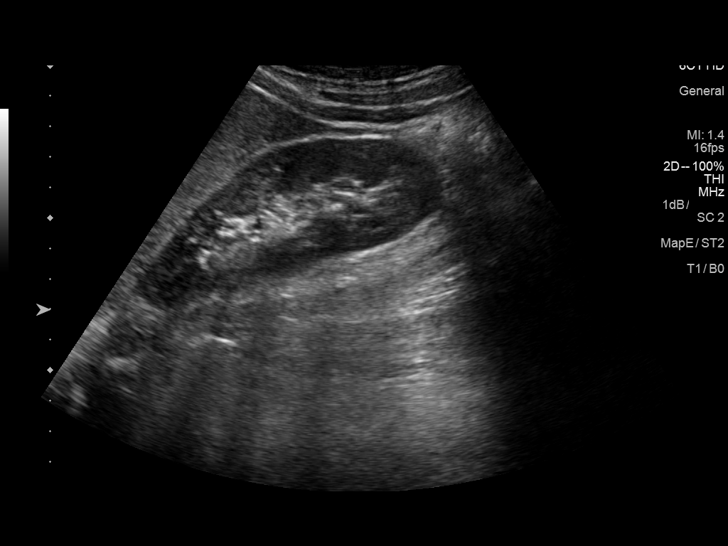
[im 43/65]
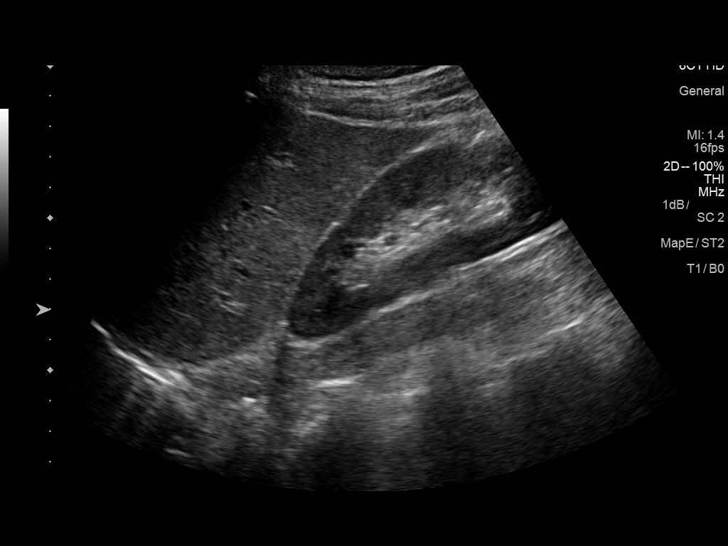
[im 49/65]
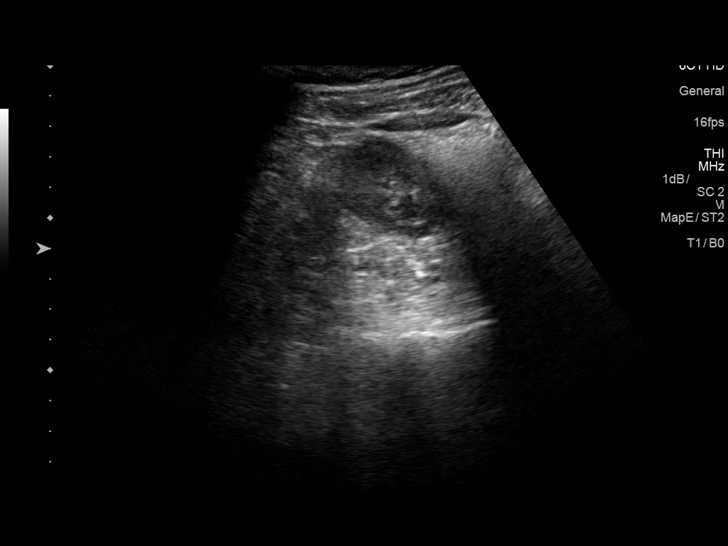
[im 54/65]
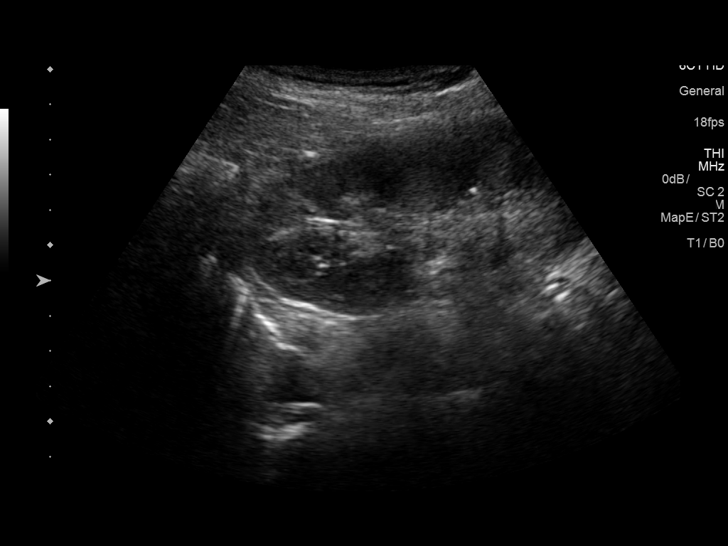
[im 59/65]
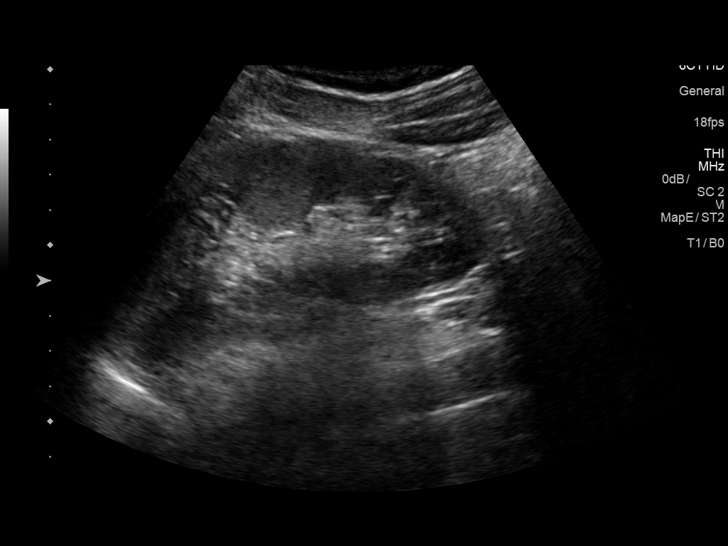
[im 65/65]
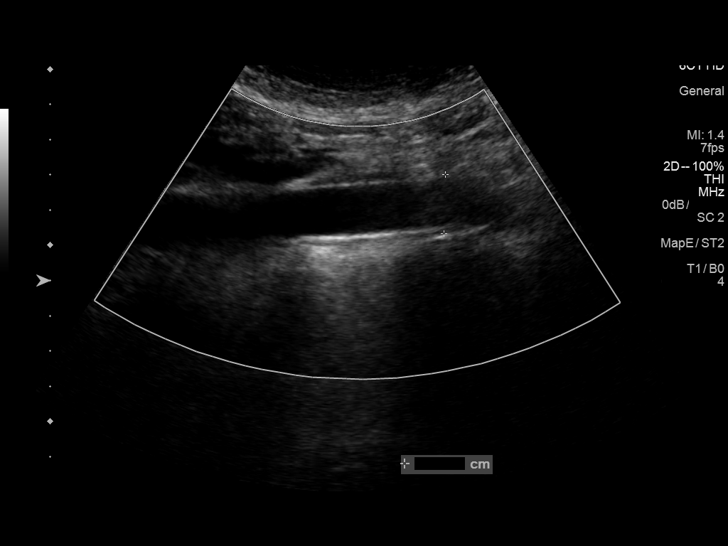

[14 of 25 positions shown; findings below may reference images not displayed]

FINDINGS: Gallbladder: No gallstones or wall thickening visualized. No
sonographic Murphy sign noted by sonographer.

Common bile duct: Diameter: 4.0 mm

Liver: No focal lesion identified. Within normal limits in
parenchymal echogenicity.

IVC: No abnormality visualized.

Pancreas: Visualized portion unremarkable.

Spleen: Size and appearance within normal limits.

Right Kidney: Length: 10.8 cm. Echogenicity within normal limits. No
mass or hydronephrosis visualized.

Left Kidney: Length: 11.3 cm. Echogenicity within normal limits. No
mass or hydronephrosis visualized.

Abdominal aorta: No aneurysm visualized.

Other findings: None.
IMPRESSION: Normal examination.

## 2019-02-11 ENCOUNTER — Other Ambulatory Visit: Payer: Self-pay | Admitting: Obstetrics and Gynecology

## 2019-02-11 DIAGNOSIS — Z1231 Encounter for screening mammogram for malignant neoplasm of breast: Secondary | ICD-10-CM

## 2019-04-10 ENCOUNTER — Ambulatory Visit: Payer: BC Managed Care – PPO

## 2019-04-17 ENCOUNTER — Ambulatory Visit: Payer: BC Managed Care – PPO | Attending: Internal Medicine

## 2019-04-17 DIAGNOSIS — Z23 Encounter for immunization: Secondary | ICD-10-CM

## 2019-04-17 NOTE — Progress Notes (Signed)
   Covid-19 Vaccination Clinic  Name:  AUTUM STETLER    MRN: [id number] DOB: 1970/01/12  04/17/2019  Ms. [place] was observed post Covid-19 immunization for 15 minutes without incident. She was provided with Vaccine Information Sheet and instruction to access the V-Safe system.   Ms. Whetsell was instructed to call 911 with any severe reactions post vaccine: Marland Kitchen Difficulty breathing  . Swelling of face and throat  . A fast heartbeat  . A bad rash all over body  . Dizziness and weakness   Immunizations Administered    Name Date Dose VIS Date Route   Pfizer COVID-19 Vaccine 04/17/2019 11:23 AM 0.3 mL 12/13/2018 Intramuscular   Manufacturer: [place]   Lot: [id number]   [place]: [id number]

## 2019-05-12 ENCOUNTER — Ambulatory Visit: Payer: BC Managed Care – PPO

## 2019-05-12 ENCOUNTER — Ambulatory Visit: Payer: BC Managed Care – PPO | Attending: Internal Medicine

## 2019-05-12 DIAGNOSIS — Z23 Encounter for immunization: Secondary | ICD-10-CM

## 2019-05-12 NOTE — Progress Notes (Signed)
   Covid-19 Vaccination Clinic  Name:  Lindsay Reynolds    MRN: [id number] DOB: Aug 20, 1970  05/12/2019  [name] was observed post Covid-19 immunization for 15 minutes without incident. She was provided with Vaccine Information Sheet and instruction to access the V-Safe system.   [name] was instructed to call 911 with any severe reactions post vaccine: Marland Kitchen Difficulty breathing  . Swelling of face and throat  . A fast heartbeat  . A bad rash all over body  . Dizziness and weakness   Immunizations Administered    Name Date Dose VIS Date Route   Pfizer COVID-19 Vaccine 05/12/2019  9:32 AM 0.3 mL 02/26/2018 Intramuscular   Manufacturer: [place]   Lot: [id number]   [place]: [id number]

## 2019-05-14 ENCOUNTER — Ambulatory Visit: Payer: BC Managed Care – PPO

## 2019-09-23 ENCOUNTER — Other Ambulatory Visit: Payer: BC Managed Care – PPO

## 2019-09-23 DIAGNOSIS — Z20822 Contact with and (suspected) exposure to covid-19: Secondary | ICD-10-CM

## 2019-09-24 LAB — NOVEL CORONAVIRUS, NAA: SARS-CoV-2, NAA: NOT DETECTED

## 2019-09-24 LAB — SARS-COV-2, NAA 2 DAY TAT

## 2019-12-01 ENCOUNTER — Ambulatory Visit: Payer: BC Managed Care – PPO

## 2019-12-05 ENCOUNTER — Ambulatory Visit: Payer: BC Managed Care – PPO | Attending: Internal Medicine

## 2019-12-05 DIAGNOSIS — Z23 Encounter for immunization: Secondary | ICD-10-CM

## 2019-12-05 NOTE — Progress Notes (Signed)
   Covid-19 Vaccination Clinic  Name:  EMBREE BRAWLEY    MRN: [id number] DOB: [birth year]  12/05/2019  Ms. Lippert was observed post Covid-19 immunization for 15 minutes without incident. She was provided with Vaccine Information Sheet and instruction to access the V-Safe system.   Ms. Coen was instructed to call 911 with any severe reactions post vaccine: Marland Kitchen Difficulty breathing  . Swelling of face and throat  . A fast heartbeat  . A bad rash all over body  . Dizziness and weakness   Immunizations Administered    Name Date Dose VIS Date Route   Pfizer COVID-19 Vaccine 12/05/2019  1:31 PM 0.3 mL 10/22/2019 Intramuscular   Manufacturer: [place]   Lot: [id number]   NDC: [address]-2

## 2019-12-19 ENCOUNTER — Ambulatory Visit: Payer: BC Managed Care – PPO

## 2020-01-05 ENCOUNTER — Other Ambulatory Visit: Payer: BC Managed Care – PPO

## 2020-01-05 DIAGNOSIS — Z20822 Contact with and (suspected) exposure to covid-19: Secondary | ICD-10-CM

## 2020-01-06 LAB — SARS-COV-2, NAA 2 DAY TAT

## 2020-01-06 LAB — NOVEL CORONAVIRUS, NAA: SARS-CoV-2, NAA: NOT DETECTED

## 2021-11-17 ENCOUNTER — Other Ambulatory Visit: Payer: Self-pay | Admitting: Family Medicine

## 2021-11-17 DIAGNOSIS — E049 Nontoxic goiter, unspecified: Secondary | ICD-10-CM

## 2021-11-21 ENCOUNTER — Ambulatory Visit
Admission: RE | Admit: 2021-11-21 | Discharge: 2021-11-21 | Disposition: A | Discharge status: Home / Self Care | Payer: BC Managed Care – PPO | Source: Ambulatory Visit | Attending: Family Medicine | Admitting: Family Medicine

## 2021-11-21 DIAGNOSIS — E049 Nontoxic goiter, unspecified: Secondary | ICD-10-CM

## 2022-06-29 ENCOUNTER — Other Ambulatory Visit: Payer: Self-pay | Admitting: Obstetrics and Gynecology

## 2022-06-29 DIAGNOSIS — R92343 Mammographic extreme density, bilateral breasts: Secondary | ICD-10-CM

## 2022-08-14 ENCOUNTER — Ambulatory Visit
Admission: RE | Admit: 2022-08-14 | Discharge: 2022-08-14 | Disposition: A | Discharge status: Home / Self Care | Payer: BC Managed Care – PPO | Source: Ambulatory Visit | Attending: Obstetrics and Gynecology | Admitting: Obstetrics and Gynecology

## 2022-08-14 DIAGNOSIS — R92343 Mammographic extreme density, bilateral breasts: Secondary | ICD-10-CM

## 2022-08-14 MED ORDER — GADOPICLENOL 0.5 MMOL/ML IV SOLN
5.0000 mL | Freq: Once | INTRAVENOUS | Status: AC | PRN
  Administered 2022-08-14: 5 mL via INTRAVENOUS
